# Patient Record
Sex: Female | Born: 1945 | Race: White | Hispanic: No | Marital: Married | Smoking: Never smoker
Health system: Southern US, Community
[De-identification: ages and names within clinical notes are randomized; demographics above are authoritative.]

## PROBLEM LIST (undated history)

## (undated) DIAGNOSIS — J45909 Unspecified asthma, uncomplicated: Secondary | ICD-10-CM

## (undated) DIAGNOSIS — H269 Unspecified cataract: Secondary | ICD-10-CM

## (undated) DIAGNOSIS — E119 Type 2 diabetes mellitus without complications: Secondary | ICD-10-CM

## (undated) DIAGNOSIS — M810 Age-related osteoporosis without current pathological fracture: Secondary | ICD-10-CM

## (undated) DIAGNOSIS — C801 Malignant (primary) neoplasm, unspecified: Secondary | ICD-10-CM

## (undated) DIAGNOSIS — R011 Cardiac murmur, unspecified: Secondary | ICD-10-CM

## (undated) DIAGNOSIS — M1990 Unspecified osteoarthritis, unspecified site: Secondary | ICD-10-CM

## (undated) DIAGNOSIS — D649 Anemia, unspecified: Secondary | ICD-10-CM

## (undated) DIAGNOSIS — K5790 Diverticulosis of intestine, part unspecified, without perforation or abscess without bleeding: Secondary | ICD-10-CM

## (undated) DIAGNOSIS — T7840XA Allergy, unspecified, initial encounter: Secondary | ICD-10-CM

## (undated) DIAGNOSIS — K219 Gastro-esophageal reflux disease without esophagitis: Secondary | ICD-10-CM

## (undated) DIAGNOSIS — I10 Essential (primary) hypertension: Secondary | ICD-10-CM

## (undated) DIAGNOSIS — E785 Hyperlipidemia, unspecified: Secondary | ICD-10-CM

## (undated) HISTORY — DX: Allergy, unspecified, initial encounter: T78.40XA

## (undated) HISTORY — DX: Type 2 diabetes mellitus without complications: E11.9

## (undated) HISTORY — DX: Anemia, unspecified: D64.9

## (undated) HISTORY — DX: Hyperlipidemia, unspecified: E78.5

## (undated) HISTORY — DX: Essential (primary) hypertension: I10

## (undated) HISTORY — PX: ABDOMINAL HYSTERECTOMY: SHX81

## (undated) HISTORY — DX: Age-related osteoporosis without current pathological fracture: M81.0

## (undated) HISTORY — DX: Diverticulosis of intestine, part unspecified, without perforation or abscess without bleeding: K57.90

## (undated) HISTORY — DX: Gastro-esophageal reflux disease without esophagitis: K21.9

## (undated) HISTORY — DX: Unspecified osteoarthritis, unspecified site: M19.90

## (undated) HISTORY — PX: COLONOSCOPY: SHX174

## (undated) HISTORY — DX: Unspecified cataract: H26.9

## (undated) HISTORY — DX: Unspecified asthma, uncomplicated: J45.909

## (undated) HISTORY — PX: APPENDECTOMY: SHX54

## (undated) HISTORY — DX: Malignant (primary) neoplasm, unspecified: C80.1

## (undated) HISTORY — DX: Cardiac murmur, unspecified: R01.1

---

## 1998-12-08 ENCOUNTER — Ambulatory Visit (HOSPITAL_COMMUNITY): Admission: RE | Admit: 1998-12-08 | Discharge: 1998-12-08 | Payer: Self-pay | Admitting: Family Medicine

## 1999-04-11 ENCOUNTER — Emergency Department (HOSPITAL_COMMUNITY): Admission: EM | Admit: 1999-04-11 | Discharge: 1999-04-11 | Payer: Self-pay | Admitting: Emergency Medicine

## 1999-04-11 ENCOUNTER — Encounter: Payer: Self-pay | Admitting: Emergency Medicine

## 2000-09-26 ENCOUNTER — Encounter: Admission: RE | Admit: 2000-09-26 | Discharge: 2000-09-26 | Payer: Self-pay | Admitting: Family Medicine

## 2000-09-26 ENCOUNTER — Encounter: Payer: Self-pay | Admitting: Family Medicine

## 2001-01-22 ENCOUNTER — Encounter: Payer: Self-pay | Admitting: Family Medicine

## 2001-01-22 ENCOUNTER — Encounter: Admission: RE | Admit: 2001-01-22 | Discharge: 2001-01-22 | Payer: Self-pay | Admitting: Family Medicine

## 2001-02-14 ENCOUNTER — Other Ambulatory Visit: Admission: RE | Admit: 2001-02-14 | Discharge: 2001-02-14 | Payer: Self-pay | Admitting: Obstetrics & Gynecology

## 2001-03-14 ENCOUNTER — Ambulatory Visit: Admission: RE | Admit: 2001-03-14 | Discharge: 2001-03-14 | Payer: Self-pay | Admitting: Gynecology

## 2001-03-16 ENCOUNTER — Encounter: Payer: Self-pay | Admitting: Obstetrics & Gynecology

## 2001-03-20 ENCOUNTER — Inpatient Hospital Stay (HOSPITAL_COMMUNITY): Admission: RE | Admit: 2001-03-20 | Discharge: 2001-03-22 | Payer: Self-pay | Admitting: Obstetrics & Gynecology

## 2001-03-20 ENCOUNTER — Encounter (INDEPENDENT_AMBULATORY_CARE_PROVIDER_SITE_OTHER): Payer: Self-pay

## 2001-10-08 ENCOUNTER — Encounter: Payer: Self-pay | Admitting: Family Medicine

## 2001-10-08 ENCOUNTER — Encounter: Admission: RE | Admit: 2001-10-08 | Discharge: 2001-10-08 | Payer: Self-pay | Admitting: Family Medicine

## 2003-12-13 HISTORY — PX: POLYPECTOMY: SHX149

## 2003-12-25 ENCOUNTER — Encounter: Admission: RE | Admit: 2003-12-25 | Discharge: 2003-12-25 | Payer: Self-pay | Admitting: Family Medicine

## 2005-05-11 ENCOUNTER — Encounter: Admission: RE | Admit: 2005-05-11 | Discharge: 2005-05-11 | Payer: Self-pay | Admitting: Internal Medicine

## 2006-05-29 ENCOUNTER — Encounter: Admission: RE | Admit: 2006-05-29 | Discharge: 2006-05-29 | Payer: Self-pay | Admitting: Internal Medicine

## 2009-04-24 ENCOUNTER — Encounter (INDEPENDENT_AMBULATORY_CARE_PROVIDER_SITE_OTHER): Payer: Self-pay | Admitting: *Deleted

## 2009-07-08 ENCOUNTER — Ambulatory Visit: Payer: Self-pay | Admitting: Gastroenterology

## 2009-07-21 ENCOUNTER — Ambulatory Visit: Payer: Self-pay | Admitting: Gastroenterology

## 2010-01-21 ENCOUNTER — Encounter: Admission: RE | Admit: 2010-01-21 | Discharge: 2010-01-21 | Payer: Self-pay | Admitting: Internal Medicine

## 2011-01-03 ENCOUNTER — Encounter: Payer: Self-pay | Admitting: Internal Medicine

## 2011-01-13 NOTE — Letter (Signed)
Summary: Recall Colonoscopy Letter  [facility]  [address] [place], [place] [address]   Phone: [phone]  Fax: [phone]      Apr 24, 2009 MRN: [id number]   [place] [address] [place], [place]  [address]   Dear Ms. Ford,   According to your medical record, it is time for you to schedule a Colonoscopy. The American Cancer Society recommends this procedure as a method to detect early colon cancer. Patients with a family history of colon cancer, or a personal history of colon polyps or inflammatory bowel disease are at increased risk.  This letter has beeen generated based on the recommendations made at the time of your procedure. If you feel that in your particular situation this may no longer apply, please contact our office.  Please call our office at [phone] to schedule this appointment or to update your records at your earliest convenience.  Thank you for cooperating with [place] to provide you with the very best care possible.   Sincerely,  Barbette Hair. Arlyce Dice, M.D.  [facility] Gastroenterology Division [phone]

## 2011-01-13 NOTE — Miscellaneous (Signed)
Summary: [facility]  Clinical Lists Changes  Medications: Added new medication of MIRALAX   POWD (POLYETHYLENE GLYCOL 3350) As per prep  instructions. - Signed Added new medication of DULCOLAX 5 MG  TBEC (BISACODYL) Day before procedure take 2 at 3pm and 2 at 8pm. - Signed Added new medication of REGLAN 10 MG  TABS (METOCLOPRAMIDE HCL) As per prep instructions. - Signed Rx of MIRALAX   POWD (POLYETHYLENE GLYCOL 3350) As per prep  instructions.;  #255gm x 0;  Signed;  Entered by: Ezra Sites RN;  Authorized by: Louis Meckel MD;  Method used: Electronically to [facility] [address] [place], [place], [place]  [address], Ph: [phone], Fax: [phone] Rx of DULCOLAX 5 MG  TBEC (BISACODYL) Day before procedure take 2 at 3pm and 2 at 8pm.;  #4 x 0;  Signed;  Entered by: Ezra Sites RN;  Authorized by: Louis Meckel MD;  Method used: Electronically to [facility] [address], [place], [place], [place]  [address], Ph: [phone], Fax: [phone] Rx of REGLAN 10 MG  TABS (METOCLOPRAMIDE HCL) As per prep instructions.;  #2 x 0;  Signed;  Entered by: Ezra Sites RN;  Authorized by: Louis Meckel MD;  Method used: Electronically to [facility] [address], [place], [place], [place]  [address], Ph: [phone], Fax: [phone] Allergies: Added new allergy or adverse reaction of PREDNISONE Observations: Added new observation of NKA: F (07/08/2009 8:47)    Prescriptions: REGLAN 10 MG  TABS (METOCLOPRAMIDE HCL) As per prep instructions.  #2 x 0   Entered by:   Ezra Sites RN   Authorized by:   Louis Meckel MD   Signed by:   Ezra Sites RN on 07/08/2009   Method used:   Electronically to        [facility] (retail)       [address]       [place], [place]  [address]       Ph: [phone]       Fax: [phone]   RxID:   [id number] DULCOLAX 5  MG  TBEC (BISACODYL) Day before procedure take 2 at 3pm and 2 at 8pm.  #4 x 0   Entered by:   Ezra Sites RN   Authorized by:   Louis Meckel MD   Signed by:   Ezra Sites RN on 07/08/2009   Method used:   Electronically to        [facility] (retail)       [address]       [place], [place]  [address]       Ph: [phone]       Fax: [phone]   RxID:   [id number] MIRALAX   POWD (POLYETHYLENE GLYCOL 3350) As per prep  instructions.  #255gm x 0   Entered by:   Ezra Sites RN   Authorized by:   Louis Meckel MD   Signed by:   Ezra Sites RN on 07/08/2009   Method used:   Electronically to        [facility] (retail)       [address]       [place], [place]  [address]       Ph: [phone]  Fax: [phone]   RxID:   [id number]

## 2011-01-13 NOTE — Procedures (Signed)
Summary: Colonoscopy   Colonoscopy  Procedure date:  07/21/2009  Findings:      Location:  [facility].    Procedures Next Due Date:    Colonoscopy: 07/2016 COLONOSCOPY PROCEDURE REPORT  PATIENT:  Debra Galvan, Debra Galvan  MR#:  [id number] BIRTHDATE:   [birth year], 63 yrs. old   GENDER:   female  ENDOSCOPIST:   Barbette Hair. Arlyce Dice, MD Referred by:   PROCEDURE DATE:  07/21/2009 PROCEDURE:  Colonoscopy, diagnostic ASA CLASS:   Class II INDICATIONS: history of pre-cancerous (adenomatous) colon polyps Last exam 2005  MEDICATIONS:    Fentanyl 100 mcg IV, Versed 10 mg IV, Benadryl 50 mg IV  DESCRIPTION OF PROCEDURE:   After the risks benefits and alternatives of the procedure were thoroughly explained, informed consent was obtained.  Digital rectal exam was performed and revealed no abnormalities.   The LB CF-[id number] endoscope was introduced through the anus and advanced to the cecum, which was identified by the ileocecal valve, without limitations.  The quality of the prep was adequate, using MiraLax.  The instrument was then slowly withdrawn as the colon was fully examined. <<PROCEDUREIMAGES>>                    <<OLD IMAGES>>  FINDINGS:  Mild diverticulosis was found in the sigmoid colon (see image1).  This was otherwise a normal examination of the colon (see image2, image3, image4, image5, image7, image8, image9, image11, image12, and image13).   Retroflexed views in the rectum revealed no abnormalities.    The scope was then withdrawn from the patient and the procedure completed.  COMPLICATIONS:   None  ENDOSCOPIC IMPRESSION:  1) Mild diverticulosis in the sigmoid colon  2) Otherwise normal examination RECOMMENDATIONS:  1) colonoscopy  7 years  REPEAT EXAM:   In 7 year(s) for Colonoscopy.   _______________________________ Barbette Hair. Arlyce Dice, MD  CC: Nila Nephew, MD

## 2011-04-29 NOTE — Consult Note (Signed)
[facility]  Patient:    Debra Galvan, Debra Galvan                       MRN: [id number] Proc. Date: 03/14/01 Attending:  Rande Brunt. Clarke-Pearson, M.D. CC:         Richard D. Arlyce Dice, M.D.  Telford Nab, R.N.   Consultation Report  HISTORY OF PRESENT ILLNESS:  [age]-year-old white married female referred by Dr. Caralyn Guile. Arlyce Dice for evaluation of a newly diagnosed pelvic mass.  The patient reports that she has had a "twinge" in her right lower quadrant over the past year or so.  She was recently evaluated and on routine pelvic exam, was found to have a mass.  The mass has been further typified with ultrasound showing that it measures 8.0 x 6.6 x 6.6 cm, with internal echoes and several thick septations.  The uterus is normal size and retroverted.  No free fluid is noted.  Patient has also had a CA125 value which measures only 11.5 u/ml.  PAST MEDICAL HISTORY:  Medical illnesses:  Hypertension, elevated cholesterol, asthma (mild), gastroesophageal reflux disease.  PAST SURGERY:  Oral surgery only.  DRUG ALLERGIES:  PREDNISONE causes increased anxiety.  PENICILLIN causes a rash.  CURRENT MEDICATIONS:  Lipitor, hydrochlorothiazide, Toprol, Ventolin inhaler p.r.n.  FAMILY HISTORY:  Family history reveals a maternal great-aunt with breast cancer and maternal aunts with pancreatic cancer and a grandfather with colon cancer.  REVIEW OF SYSTEMS:  Review of systems is otherwise negative.  PHYSICAL EXAMINATION:  GENERAL:  The patient is a healthy white female in no acute distress.  HEENT:  Negative.  NECK:  Neck is supple without thyromegaly.  NODES:  There is supraclavicular or inguinal adenopathy.  ABDOMEN:  The abdomen is slightly obese, soft and nontender.  No mass, organomegaly, ascites or herniae are noted.  PELVIC:  EG/BUS normal.  Vagina is clean, well-supported.  The cervix is normal.  Uterus is normal shape, size and consistency.   There is an 8-cm smooth cystic mass in the posterior pelvis.  Rectovaginal exam confirms.  IMPRESSION:  Complex pelvic mass with a normal CA125, rule out ovarian neoplasm.  I had a lengthy discussion with the patient and her husband regarding the management of benign and malignant ovarian tumors.  They are aware of the risks of surgery including hemorrhage or infection, injury to adjacent viscera and thromboembolic complications.  They are further aware that if this is found to be a cancer, extended surgical staging including omentectomy, pelvic and para-aortic lymphadenectomy may be performed.  Further, they are aware that she may require postoperative chemotherapy.  All of their questions are answered and they wish to proceed with surgery next week. DD:  03/14/01 TD:  03/15/01 Job: [id number]

## 2011-04-29 NOTE — Op Note (Signed)
[facility]  Patient:    Debra Galvan, Debra Galvan                  MRN: [id number] Proc. Date: 03/20/01 Adm. Date:  [id number] Attending:  Lars Pinks CC:         Rande Brunt. Clarke-Pearson, M.D.                           Operative Report  PREOPERATIVE DIAGNOSIS:  Right adnexal mass.  POSTOPERATIVE DIAGNOSIS:  Right mucinous cystadenoma.  PROCEDURE:  Total abdominal hysterectomy, bilateral salpingo-oophorectomy, appendectomy.  SURGEON:  Richard D. Arlyce Dice, M.D.  ASSISTANT:  Rande Brunt. Clarke-Pearson, M.D.  ANESTHESIA:  General endotracheal.  ESTIMATED BLOOD LOSS:  200 cc.  FINDINGS:  Six-centimeter mucinous cystadenoma.  The pelvis appeared normal, the appendix was normal, the left ovary was atrophic and the uterus was normal.  There was no evidence of endometriosis or adhesions in the pelvis and the upper abdomen appeared normal.  INDICATIONS:  This is a 65 year old gravida 3, para 3 that was found to have a right adnexal mass on pelvic exam and confirmed by ultrasound.  Preoperative evaluation showed a CA125 of 11.5.  The surgical options were discussed with the patient and the decision was made to proceed with total abdominal hysterectomy and bilateral salpingo-oophorectomy at minimum and to do further cancer staging if on frozen section ovarian cancer were found to be present.  DESCRIPTION OF PROCEDURE:  The patient was taken to the operating room, placed in supine position and general endotracheal anesthesia was induced.  She was then placed in the modified dorsal lithotomy position and the abdomen and perineum and vagina were prepped and draped in a sterile fashion.  The bladder was catheterized.  A low vertical incision was made and carried down to the fascia, which extended vertically.  The rectus muscle was divided in the midline and the peritoneum was entered by sharp and blunt dissection.  This incision was then extended vertically  as well.  The abdominal retractor was then placed, the bowel was packed, the pelvis was inspected.  The round ligament on the right side was divided and the retroperitoneal space was entered.  The ureter was identified.  The infundibulopelvic ligament was isolated and doubly ligated.  The dissection was then carried around anteriorly to create the bladder flap.  The right adnexal mass was then mobilized and removed and sent for frozen section.  The left round ligament was then divided and the infundibulopelvic ligament on the left side was isolated and divided and doubly ligated.  As the bladder was displaced inferiorly, the uterine arteries were then clamped, cut and ligated.  The parametrial tissues were then divided and the vagina was entered and the uterus, cervix and left adnexa were removed.  The vaginal cuff was closed with figure-of-eight Vicryl 1 sutures.  There was some bleeding at the bladder flap and this was controlled with Hemoclips and cautery.  At this point, attention was turned to the appendix, which appeared normal.  The appendiceal artery was isolated and ligated.  The appendix was then removed and the stump was ligated and then oversewn into the cecum with a pursestring silk suture.  The peritoneum was irrigated.  The abdomen was closed with a running Smead-Jones closure using 0 PDS suture.  The skin was then closed with staples.  The patient tolerated the procedure well and left the operating room in good condition. DD:  03/20/01  TD:  03/20/01 Job: [id number]

## 2011-04-29 NOTE — Discharge Summary (Signed)
[facility]  Patient:    Debra Galvan, Debra Galvan                  MRN: [id number] Adm. Date:  [id number] Disc. Date: 03/22/01 Attending:  Lars Pinks                           Discharge Summary  DISCHARGE DIAGNOSIS:  Mucinous cyst adenoma.  PROCEDURE:  Total abdominal hysterectomy with bilateral salpingo-oophorectomy and appendectomy.  No complications.  CONDITION ON DISCHARGE:  Improved.  HISTORY OF PRESENT ILLNESS:  This is a 65 year old, G3, P3 who was admitted for evaluation of a right adnexal mass.  The patient was noted to have a 6 cm complex, right ovarian cyst on office ultrasound approximately two weeks prior to admission.  CA 125 was drawn at that time which was 11.  She had consultation with Dr. De Blanch who agreed that the mass had to be removed for evaluation to rule out ovarian cancer.  HOSPITAL COURSE:  The patient was brought to the operating room on the day of admission where a right salpingo-oophorectomy was performed and frozen section revealed benign mucinous cyst adenoma.  Based upon the patients request after consultation preoperatively, the decision was made to proceed with total abdominal hysterectomy and removal of the left tube and ovary.  This was performed during the same operation without complication.  The patients postoperative course is benign without significant fever or anemia.  On postop day #2, the patient was ambulating well, urinating and passing flatus.  Her pain was controlled with oral pain medications and she was felt to be ready for discharge.  DIET:  Regular.  ACTIVITY:  Limit activity.  DISCHARGE MEDICATIONS: 1. Tylox 30 tablets one to two every four hours for pain. 2. She was told to    try ibuprofen or Naproxen first and then to use the narcotic if her pain    persisted. 3. Iron supplementation once her stools were normal. 4. She was to return to the office in five days to have  her staples removed    and in three weeks for routine evaluation.  LABORATORY DATA AND X-RAY FINDINGS:  Her hemoglobin preoperatively was 12.4 with a white count of 8700.  Liver function tests and routine chemistries were all normal.  Blood type was O positive.  Postoperatively, her hemoglobin was 9.9, white count 13.3.  Her pathology report revealed right ovary and fallopian tube benign mucinous cyst adenoma.  Her uterus and left ovary were benign. DD:  03/22/01 TD:  03/22/01 Job: [id number]

## 2011-04-29 NOTE — H&P (Signed)
[facility]  Patient:    Debra Galvan, Debra Galvan                       MRN: [id number] Adm. Date:  03/20/01 Attending:  Caralyn Guile. Arlyce Dice, M.D.                         History and Physical  HISTORY OF PRESENT ILLNESS:  This is a 65 year old gravida 3, para 3, who is admitted for evaluation of a right adnexal mass.  The patient came to the office for her regular physical exam and complaining of occasional twinging in the right lower quadrant.  Otherwise the patient had no complaints.  On physical exam, an adnexal mass was felt in the right side and on ultrasound, a 6 x 8 cm complex mass was confirmed.  Based upon this presentation, a decision was made to proceed with operative evaluation to rule out ovarian cancer.  PAST MEDICAL HISTORY:  Positive for hypertension, for which he is being treated with Toprol and hydrochlorothiazide.  Patient also has asthma, for which he takes an albuterol inhaler, and hypercholesterolemia for which she is on Lipitor.  The patient has had no previous abdominal surgery.  MEDICATIONS:  Toprol, Lipitor, hydrochlorothiazide, and albuterol inhaler.  ALLERGIES:  SULFA and PREDNISONE.  She has never had a blood transfusion.  FAMILY HISTORY:  Negative for breast, ovarian, or colon cancer.  SOCIAL HISTORY:  She does not smoke and she does not drink alcohol to excess. She does not use recreational drugs.  REVIEW OF SYSTEMS:  Negative in detail.  PHYSICAL EXAMINATION:  VITAL SIGNS:  Blood pressure 140/90, height 5 feet 6 inches, weight 188 pounds.  She is afebrile.  Her pulse is 80 and regular.  HEENT:  Ears, nose, and throat normal.  NECK:  Supple without thyromegaly.  BREASTS:  Without masses.  HEART:  Shows a 2/6 systolic ejection murmur at the left base.  LUNGS:  Clear to auscultation and percussion.  ABDOMEN:  Soft without hepatosplenomegaly.  PELVIC:  External genitalia, vagina, and cervix are all normal.  Uterus  is retroverted, normal size, shape, and contour.  The right adnexa reveals a mass approximately 6 cm.  RECTAL:  Normal.  Hemoccult was negative.  LABORATORY:  Blood test was done preoperatively, measuring her CA-125 came back normal at 11.5.  A preoperative chest x-ray showed no evidence of acute cardiac or pulmonary problems.  ADMITTING DIAGNOSIS:  A complex right adnexal mass, rule out ovarian cancer. Hopefully this is a benign cystadenoma or even an old endometrioma.  DISCUSSION OF HER HOSPITAL PLAN:  Per discussion with the patient, a total abdominal hysterectomy and bilateral salpingo-oophorectomy will be performed and if a frozen section of her adnexal mass reveals cancer, then additional surgery will be undertaken as appropriate for staging and debulking. DD:  03/19/01 TD:  03/19/01 Job: [id number]

## 2011-04-29 NOTE — Consult Note (Signed)
[facility]  Patient:    Debra Galvan, Debra Galvan                       MRN: [id number] Proc. Date: 03/14/01 Attending:  Rande Brunt. Clarke-Pearson, M.D. CC:         Richard D. Arlyce Dice, M.D.  Telford Nab, R.N.   Consultation Report  HISTORY OF PRESENT ILLNESS:  [age]-year-old white married female referred by Dr. Caralyn Guile. Arlyce Dice for evaluation of a newly diagnosed pelvic mass.  The patient reports that she has had a "twinge" in her right lower quadrant for the past several years.  She has not been having annual examinations.  She did present to Dr. Arlyce Dice recently, who discovered a pelvic mass.  The ultrasound has been performed showing a 6.6 x 6.6 x 8.0-cm mass in the right adnexa with internal echoes and several fixed septations.  No free fluid was seen.  The uterus is relatively normal but retroverted.  CA125 value is 11.5 u/ml. Patient has had a recent Pap smear.  She denies any GI or GU symptoms, has no other pelvic pain or abdominal pressure.  PAST MEDICAL HISTORY:  Medical illnesses:  Hypertension, hypercholesterolemia, mild asthma, gastroesophageal reflux disease.  PAST SURGERY:  Oral surgery only.  CURRENT MEDICATIONS:  Lipitor, hydrochlorothiazide, Toprol and bronchodilator inhaler p.r.n.  ALLERGIES:  PENICILLIN and PREDNISONE.  OBSTETRICAL HISTORY:  Gravida 3.  GYNECOLOGICAL HISTORY:  Gynecologic history is negative except for that noted in history of present illness.  REVIEW OF SYSTEMS:  Review of systems is negative except for that noted in present illness.  FAMILY HISTORY:  Family history reveals a maternal great-aunt with breast cancer, a maternal aunt with pancreatic cancer and a grandfather with colon cancer.  PHYSICAL EXAMINATION:  GENERAL:  Physical exam reveals a well-developed white female in no acute distress.  HEENT:  Negative.  NECK:  Neck is supple without thyromegaly.  NODES:  There is no supraclavicular or  inguinal adenopathy.  ABDOMEN:  The abdomen is moderately obese, soft, nontender.  No mass, organomegaly, ascites or herniae are noted.  PELVIC:  EG/BUS normal.  Vagina is clean and cervix is normal.  Uterus is deviated slightly to the left.  Behind and to the right of the uterus is approximately an 8-cm smooth cystic mass.  Rectovaginal exam confirms.  IMPRESSION:  Complex pelvic mass with a normal CA125 value.  We would recommend the patient undergo exploratory laparotomy, total abdominal hysterectomy and bilateral salpingo-oophorectomy so that we can determine the exact histology of this mass.  The risks of surgery including hemorrhage or infection, injury to adjacent viscera and thromboembolic complications were outlined to the patient and her husband.  All of their questions are answered and we will proceed with surgery in coordination with Dr. Arlyce Dice next week. DD: 03/14/01 TD:  03/14/01 Job: [id number]

## 2011-08-24 ENCOUNTER — Other Ambulatory Visit: Payer: Self-pay | Admitting: Internal Medicine

## 2011-08-24 DIAGNOSIS — Z1231 Encounter for screening mammogram for malignant neoplasm of breast: Secondary | ICD-10-CM

## 2011-09-02 ENCOUNTER — Ambulatory Visit
Admission: RE | Admit: 2011-09-02 | Discharge: 2011-09-02 | Disposition: A | Discharge status: Home / Self Care | Payer: Medicare Other | Source: Ambulatory Visit | Attending: Internal Medicine | Admitting: Internal Medicine

## 2011-09-02 DIAGNOSIS — Z1231 Encounter for screening mammogram for malignant neoplasm of breast: Secondary | ICD-10-CM

## 2012-09-10 ENCOUNTER — Other Ambulatory Visit: Payer: Self-pay | Admitting: Internal Medicine

## 2012-09-10 DIAGNOSIS — Z1231 Encounter for screening mammogram for malignant neoplasm of breast: Secondary | ICD-10-CM

## 2012-10-03 ENCOUNTER — Ambulatory Visit
Admission: RE | Admit: 2012-10-03 | Discharge: 2012-10-03 | Disposition: A | Discharge status: Home / Self Care | Payer: Medicare Other | Source: Ambulatory Visit | Attending: Internal Medicine | Admitting: Internal Medicine

## 2012-10-03 DIAGNOSIS — Z1231 Encounter for screening mammogram for malignant neoplasm of breast: Secondary | ICD-10-CM

## 2013-10-02 ENCOUNTER — Other Ambulatory Visit: Payer: Self-pay

## 2013-10-02 DIAGNOSIS — Z1231 Encounter for screening mammogram for malignant neoplasm of breast: Secondary | ICD-10-CM

## 2013-10-18 ENCOUNTER — Ambulatory Visit
Admission: RE | Admit: 2013-10-18 | Discharge: 2013-10-18 | Disposition: A | Discharge status: Home / Self Care | Payer: Medicare Other | Source: Ambulatory Visit

## 2013-10-18 DIAGNOSIS — Z1231 Encounter for screening mammogram for malignant neoplasm of breast: Secondary | ICD-10-CM

## 2013-11-18 ENCOUNTER — Encounter: Payer: Self-pay | Admitting: Cardiology

## 2014-07-24 ENCOUNTER — Encounter: Payer: Self-pay | Admitting: Gastroenterology

## 2014-10-13 ENCOUNTER — Ambulatory Visit (INDEPENDENT_AMBULATORY_CARE_PROVIDER_SITE_OTHER): Payer: Medicare Other | Admitting: Internal Medicine

## 2014-10-13 VITALS — BP 142/72 | HR 72 | Temp 98.6°F | Resp 16 | Ht 65.0 in | Wt 190.0 lb

## 2014-10-13 DIAGNOSIS — J0100 Acute maxillary sinusitis, unspecified: Secondary | ICD-10-CM

## 2014-10-13 MED ORDER — AMOXICILLIN 875 MG PO TABS: 875.0000 mg | ORAL_TABLET | Freq: Two times a day (BID) | ORAL | Status: DC

## 2014-10-13 NOTE — Progress Notes (Signed)
   Subjective:  This chart was scribed for Debra Koyanagi, MD by Debra Galvan, ED Scribe. The patient was seen in room 2. Patient's care was started at 4:58 PM.   Patient ID: Debra Galvan, female    DOB: 1946-06-09, 68 y.o.   MRN: [id number]  Chief Complaint  Patient presents with  . Cough    x 4 days  . Ear Pain    right, pressure   HPI HPI Comments: Debra Galvan is a 68 y.o. female who presents to the Emergency Department complaining of persistent cough onset 4 days ago. Pt reports associated fever 4 days ago that has resolved, sore throat 2 weeks ago, postnasal drip, nodule at R tear duck onset this morning, and R ear sensitivity. She denies rhinorrhea. She reports sick contacts with URIs and ear infections. She has tried Proair inhaler once and cough syrup with mild relief.    Past Medical History  Diagnosis Date  . Allergy   . Anemia   . Asthma   . Cancer   . GERD (gastroesophageal reflux disease)   . Heart murmur   . Hypertension   . Osteoporosis    No current outpatient prescriptions on file prior to visit.   No current facility-administered medications on file prior to visit.   Allergies  Allergen Reactions  . Prednisone     REACTION: Hyperactivity    Review of Systems  Constitutional: Positive for fever (resolved).  HENT: Positive for ear pain, postnasal drip and sore throat (resolved). Negative for rhinorrhea.   Respiratory: Positive for cough.   Allergic/Immunologic: Positive for environmental allergies.      Objective:   Physical Exam  Constitutional: She is oriented to person, place, and time. She appears well-developed and well-nourished. No distress.  HENT:  Head: Normocephalic and atraumatic.  Right Ear: Tympanic membrane normal.  Nares with purulent mucous   Eyes: Conjunctivae and EOM are normal.  Neck: Neck supple.  Cardiovascular: Normal rate and regular rhythm.   Pulmonary/Chest: Effort normal. No respiratory distress. She has  wheezes.  Lungs clear with the exception of wheezing at the end of forced expirations   Musculoskeletal: Normal range of motion.  Lymphadenopathy:    She has no cervical adenopathy.  Neurological: She is alert and oriented to person, place, and time.  Skin: Skin is warm and dry.  Psychiatric: She has a normal mood and affect. Her behavior is normal.  Nursing note and vitals reviewed.  Triage Vitals: BP 142/72 mmHg  Pulse 72  Temp(Src) 98.6 F (37 C)  Resp 16  Ht 5\' 5"  (1.651 m)  Wt 190 lb (86.183 kg)  BMI 31.62 kg/m2  SpO2 94%     Assessment & Plan:   I personally performed the services described in this documentation, which was scribed in my presence. The recorded information has been reviewed and is accurate.  Acute maxillary sinusitis, recurrence not specified  Amoxicillin

## 2015-04-10 ENCOUNTER — Other Ambulatory Visit: Payer: Self-pay

## 2015-04-10 DIAGNOSIS — Z1231 Encounter for screening mammogram for malignant neoplasm of breast: Secondary | ICD-10-CM

## 2015-04-14 ENCOUNTER — Ambulatory Visit
Admission: RE | Admit: 2015-04-14 | Discharge: 2015-04-14 | Disposition: A | Discharge status: Home / Self Care | Payer: Medicare Other | Source: Ambulatory Visit

## 2015-04-14 DIAGNOSIS — Z1231 Encounter for screening mammogram for malignant neoplasm of breast: Secondary | ICD-10-CM

## 2016-05-04 ENCOUNTER — Other Ambulatory Visit: Payer: Self-pay

## 2016-05-04 DIAGNOSIS — Z1231 Encounter for screening mammogram for malignant neoplasm of breast: Secondary | ICD-10-CM

## 2016-05-25 ENCOUNTER — Ambulatory Visit: Payer: Medicare Other

## 2016-06-01 ENCOUNTER — Ambulatory Visit
Admission: RE | Admit: 2016-06-01 | Discharge: 2016-06-01 | Disposition: A | Discharge status: Home / Self Care | Payer: Medicare Other | Source: Ambulatory Visit

## 2016-06-01 DIAGNOSIS — Z1231 Encounter for screening mammogram for malignant neoplasm of breast: Secondary | ICD-10-CM

## 2016-07-11 ENCOUNTER — Encounter: Payer: Self-pay | Admitting: Gastroenterology

## 2017-01-10 ENCOUNTER — Other Ambulatory Visit: Payer: Self-pay | Admitting: Internal Medicine

## 2017-01-10 DIAGNOSIS — M8580 Other specified disorders of bone density and structure, unspecified site: Secondary | ICD-10-CM

## 2017-01-18 ENCOUNTER — Ambulatory Visit
Admission: RE | Admit: 2017-01-18 | Discharge: 2017-01-18 | Disposition: A | Discharge status: Home / Self Care | Payer: Medicare Other | Source: Ambulatory Visit | Attending: Internal Medicine | Admitting: Internal Medicine

## 2017-01-18 DIAGNOSIS — M8580 Other specified disorders of bone density and structure, unspecified site: Secondary | ICD-10-CM

## 2017-05-02 ENCOUNTER — Ambulatory Visit
Admission: RE | Admit: 2017-05-02 | Discharge: 2017-05-02 | Disposition: A | Discharge status: Home / Self Care | Payer: Medicare Other | Source: Ambulatory Visit | Attending: Internal Medicine | Admitting: Internal Medicine

## 2017-05-02 ENCOUNTER — Other Ambulatory Visit: Payer: Self-pay | Admitting: Internal Medicine

## 2017-05-02 DIAGNOSIS — M79641 Pain in right hand: Secondary | ICD-10-CM

## 2017-05-02 DIAGNOSIS — M25531 Pain in right wrist: Secondary | ICD-10-CM

## 2017-08-18 ENCOUNTER — Other Ambulatory Visit: Payer: Self-pay | Admitting: Internal Medicine

## 2017-08-18 DIAGNOSIS — Z1231 Encounter for screening mammogram for malignant neoplasm of breast: Secondary | ICD-10-CM

## 2017-08-25 ENCOUNTER — Ambulatory Visit: Payer: Medicare Other

## 2017-09-25 ENCOUNTER — Ambulatory Visit
Admission: RE | Admit: 2017-09-25 | Discharge: 2017-09-25 | Disposition: A | Discharge status: Home / Self Care | Payer: Medicare Other | Source: Ambulatory Visit | Attending: Internal Medicine | Admitting: Internal Medicine

## 2017-09-25 DIAGNOSIS — Z1231 Encounter for screening mammogram for malignant neoplasm of breast: Secondary | ICD-10-CM

## 2018-10-03 ENCOUNTER — Other Ambulatory Visit: Payer: Self-pay | Admitting: Internal Medicine

## 2018-10-03 DIAGNOSIS — Z1231 Encounter for screening mammogram for malignant neoplasm of breast: Secondary | ICD-10-CM

## 2018-11-15 ENCOUNTER — Ambulatory Visit
Admission: RE | Admit: 2018-11-15 | Discharge: 2018-11-15 | Disposition: A | Discharge status: Home / Self Care | Payer: Medicare Other | Source: Ambulatory Visit | Attending: Internal Medicine | Admitting: Internal Medicine

## 2018-11-15 DIAGNOSIS — Z1231 Encounter for screening mammogram for malignant neoplasm of breast: Secondary | ICD-10-CM

## 2019-01-03 ENCOUNTER — Other Ambulatory Visit (HOSPITAL_COMMUNITY): Payer: Self-pay | Admitting: Internal Medicine

## 2019-01-03 ENCOUNTER — Ambulatory Visit (HOSPITAL_COMMUNITY)
Admission: RE | Admit: 2019-01-03 | Discharge: 2019-01-03 | Disposition: A | Discharge status: Home / Self Care | Payer: Medicare Other | Source: Ambulatory Visit | Attending: Internal Medicine | Admitting: Internal Medicine

## 2019-01-03 DIAGNOSIS — M7989 Other specified soft tissue disorders: Secondary | ICD-10-CM | POA: Insufficient documentation

## 2019-01-03 DIAGNOSIS — M79604 Pain in right leg: Secondary | ICD-10-CM | POA: Diagnosis present

## 2019-01-03 NOTE — Progress Notes (Signed)
RLE venous duplex       has been completed. Preliminary results can be found under CV proc through chart review. Gerardo Territo, BS, RDMS, RVT   

## 2019-07-08 ENCOUNTER — Encounter: Payer: Self-pay | Admitting: Gastroenterology

## 2019-07-17 ENCOUNTER — Other Ambulatory Visit (HOSPITAL_COMMUNITY): Payer: Self-pay | Admitting: Internal Medicine

## 2019-07-17 DIAGNOSIS — R011 Cardiac murmur, unspecified: Secondary | ICD-10-CM

## 2019-07-18 ENCOUNTER — Ambulatory Visit (HOSPITAL_COMMUNITY)
Admission: RE | Admit: 2019-07-18 | Discharge: 2019-07-18 | Disposition: A | Discharge status: Home / Self Care | Payer: Medicare Other | Source: Ambulatory Visit | Attending: Internal Medicine | Admitting: Internal Medicine

## 2019-07-18 ENCOUNTER — Other Ambulatory Visit: Payer: Self-pay

## 2019-07-18 DIAGNOSIS — R011 Cardiac murmur, unspecified: Secondary | ICD-10-CM | POA: Insufficient documentation

## 2019-07-18 NOTE — Progress Notes (Signed)
  Echocardiogram 2D Echocardiogram has been performed.  Debra Galvan 07/18/2019, 1:25 PM

## 2019-07-25 ENCOUNTER — Encounter: Payer: Self-pay | Admitting: Gastroenterology

## 2019-08-16 ENCOUNTER — Other Ambulatory Visit: Payer: Self-pay

## 2019-08-16 ENCOUNTER — Ambulatory Visit (AMBULATORY_SURGERY_CENTER): Payer: Self-pay | Admitting: *Deleted

## 2019-08-16 VITALS — Temp 97.1°F | Ht 65.5 in | Wt 176.0 lb

## 2019-08-16 DIAGNOSIS — Z86010 Personal history of colonic polyps: Secondary | ICD-10-CM

## 2019-08-16 MED ORDER — PEG 3350-KCL-NA BICARB-NACL 420 G PO SOLR: 4000.0000 mL | Freq: Once | ORAL | 0 refills | Status: AC

## 2019-08-16 NOTE — Progress Notes (Signed)
No egg or soy allergy known to patient  No issues with past sedation with any surgeries  or procedures, no intubation problems  No diet pills per patient No home 02 use per patient  No blood thinners per patient  Pt denies issues with constipation  No A fib or A flutter  EMMI video sent to pt's e mail  

## 2019-08-30 ENCOUNTER — Encounter: Payer: Self-pay | Admitting: Gastroenterology

## 2019-08-30 ENCOUNTER — Ambulatory Visit (AMBULATORY_SURGERY_CENTER): Payer: Medicare Other | Admitting: Gastroenterology

## 2019-08-30 ENCOUNTER — Other Ambulatory Visit: Payer: Self-pay | Admitting: Gastroenterology

## 2019-08-30 ENCOUNTER — Other Ambulatory Visit: Payer: Self-pay

## 2019-08-30 VITALS — BP 104/49 | HR 52 | Temp 98.4°F | Resp 25 | Ht 65.0 in | Wt 176.0 lb

## 2019-08-30 DIAGNOSIS — Z1211 Encounter for screening for malignant neoplasm of colon: Secondary | ICD-10-CM

## 2019-08-30 DIAGNOSIS — D120 Benign neoplasm of cecum: Secondary | ICD-10-CM | POA: Diagnosis not present

## 2019-08-30 DIAGNOSIS — D127 Benign neoplasm of rectosigmoid junction: Secondary | ICD-10-CM

## 2019-08-30 DIAGNOSIS — D123 Benign neoplasm of transverse colon: Secondary | ICD-10-CM

## 2019-08-30 DIAGNOSIS — D128 Benign neoplasm of rectum: Secondary | ICD-10-CM

## 2019-08-30 DIAGNOSIS — Z86010 Personal history of colonic polyps: Secondary | ICD-10-CM | POA: Diagnosis not present

## 2019-08-30 MED ORDER — SODIUM CHLORIDE 0.9 % IV SOLN: 500.0000 mL | Freq: Once | INTRAVENOUS | Status: DC

## 2019-08-30 NOTE — Patient Instructions (Signed)
YOU HAD AN ENDOSCOPIC PROCEDURE TODAY AT THE [facility]:   Refer to the procedure report that was given to you for any specific questions about what was found during the examination.  If the procedure report does not answer your questions, please call your gastroenterologist to clarify.  If you requested that your care partner not be given the details of your procedure findings, then the procedure report has been included in a sealed envelope for you to review at your convenience later.  YOU SHOULD EXPECT: Some feelings of bloating in the abdomen. Passage of more gas than usual.  Walking can help get rid of the air that was put into your GI tract during the procedure and reduce the bloating. If you had a lower endoscopy (such as a colonoscopy or flexible sigmoidoscopy) you may notice spotting of blood in your stool or on the toilet paper. If you underwent a bowel prep for your procedure, you may not have a normal bowel movement for a few days.  Please Note:  You might notice some irritation and congestion in your nose or some drainage.  This is from the oxygen used during your procedure.  There is no need for concern and it should clear up in a day or so.  SYMPTOMS TO REPORT IMMEDIATELY:   Following lower endoscopy (colonoscopy or flexible sigmoidoscopy):  Excessive amounts of blood in the stool  Significant tenderness or worsening of abdominal pains  Swelling of the abdomen that is new, acute  Fever of 100F or higher   For urgent or emergent issues, a gastroenterologist can be reached at any hour by calling [phone].   DIET:  We do recommend a small meal at first, but then you may proceed to your regular diet.  Drink plenty of fluids but you should avoid alcoholic beverages for 24 hours.  MEDICATIONS: Continue present medications.  Please see handouts given to you by your recovery nurse.  ACTIVITY:  You should plan to take it easy for the rest of today and you should  NOT DRIVE or use heavy machinery until tomorrow (because of the sedation medicines used during the test).    FOLLOW UP: Our staff will call the number listed on your records 48-72 hours following your procedure to check on you and address any questions or concerns that you may have regarding the information given to you following your procedure. If we do not reach you, we will leave a message.  We will attempt to reach you two times.  During this call, we will ask if you have developed any symptoms of COVID 19. If you develop any symptoms (ie: fever, flu-like symptoms, shortness of breath, cough etc.) before then, please call [phone].  If you test positive for Covid 19 in the 2 weeks post procedure, please call and report this information to us.    If any biopsies were taken you will be contacted by phone or by letter within the next 1-3 weeks.  Please call us at [phone] if you have not heard about the biopsies in 3 weeks.   Thank you for allowing us to provide for your healthcare needs today.   SIGNATURES/CONFIDENTIALITY: You and/or your care partner have signed paperwork which will be entered into your electronic medical record.  These signatures attest to the fact that that the information above on your After Visit Summary has been reviewed and is understood.  Full responsibility of the confidentiality of this discharge information lies with you and/or   your care-partner. 

## 2019-08-30 NOTE — Progress Notes (Signed)
Temp check by KA/vital check by CW.  No changes in medical or surgical history from pre-visit screening on 9/4.

## 2019-08-30 NOTE — Progress Notes (Signed)
A and O x3. Report to RN. Tolerated MAC anesthesia well.

## 2019-08-30 NOTE — Progress Notes (Signed)
Called to room to assist during endoscopic procedure.  Patient ID and intended procedure confirmed with present staff. Received instructions for my participation in the procedure from the performing physician.  

## 2019-08-30 NOTE — Op Note (Signed)
[facility] Patient Name: [name] Procedure Date: 08/30/2019 11:19 AM MRN: [id number] Endoscopist: Remo Lipps P. Havery Moros , MD Age: 73 Referring MD:  Date of Birth: 06-Jul-1946 Gender: Female Account #: [id number] Procedure:                Colonoscopy Indications:              Screening for colorectal malignant neoplasm Medicines:                Monitored Anesthesia Care Procedure:                Pre-Anesthesia Assessment:                           - Prior to the procedure, a History and Physical                            was performed, and patient medications and                            allergies were reviewed. The patient's tolerance of                            previous anesthesia was also reviewed. The risks                            and benefits of the procedure and the sedation                            options and risks were discussed with the patient.                            All questions were answered, and informed consent                            was obtained. Prior Anticoagulants: The patient has                            taken no previous anticoagulant or antiplatelet                            agents. ASA Grade Assessment: II - A patient with                            mild systemic disease. After reviewing the risks                            and benefits, the patient was deemed in                            satisfactory condition to undergo the procedure.                           After obtaining informed consent, the colonoscope  was passed under direct vision. Throughout the                            procedure, the patient's blood pressure, pulse, and                            oxygen saturations were monitored continuously. The                            Colonoscope was introduced through the anus and                            advanced to the the cecum, identified by                            appendiceal orifice  and ileocecal valve. The                            colonoscopy was technically difficult and complex                            due to restricted mobility of the colon. The                            patient tolerated the procedure well. The quality                            of the bowel preparation was adequate. The                            ileocecal valve, appendiceal orifice, and rectum                            were photographed. Scope In: 11:23:51 AM Scope Out: 11:53:40 AM Scope Withdrawal Time: 0 hours 18 minutes 21 seconds  Total Procedure Duration: 0 hours 29 minutes 49 seconds  Findings:                 The perianal and digital rectal examinations were                            normal.                           Two sessile polyps were found in the cecum. The                            polyps were 3 to 4 mm in size. These polyps were                            removed with a cold snare. Resection and retrieval                            were complete.  A 5 mm polyp was found in the hepatic flexure. The                            polyp was sessile. The polyp was removed with a                            cold snare. Resection and retrieval were complete.                           A diminutive polyp was found in the recto-sigmoid                            colon. The polyp was sessile. The polyp was removed                            with a cold snare. Resection and retrieval were                            complete.                           Multiple small-mouthed diverticula were found in                            the sigmoid colon.                           The colon was tortuous with a severely restricted                            rectosigmoid / distal sigmoid colon. Pediatric                            colonoscope used to traverse the colon.                           The exam was otherwise without abnormality. Complications:            No  immediate complications. Estimated blood loss:                            Minimal. Estimated Blood Loss:     Estimated blood loss was minimal. Impression:               - Two 3 to 4 mm polyps in the cecum, removed with a                            cold snare. Resected and retrieved.                           - One 5 mm polyp at the hepatic flexure, removed                            with a cold snare. Resected and retrieved.                           -  One diminutive polyp at the recto-sigmoid colon,                            removed with a cold snare. Resected and retrieved.                           - Diverticulosis in the sigmoid colon.                           - Tortuous colon with restricted left colon as                            above.                           - The examination was otherwise normal. Recommendation:           - Patient has a contact number available for                            emergencies. The signs and symptoms of potential                            delayed complications were discussed with the                            patient. Return to normal activities tomorrow.                            Written discharge instructions were provided to the                            patient.                           - Resume previous diet.                           - Continue present medications.                           - Await pathology results. Remo Lipps P. Armbruster, MD 08/30/2019 11:58:46 AM This report has been signed electronically.

## 2019-09-03 ENCOUNTER — Telehealth: Payer: Self-pay | Admitting: *Deleted

## 2019-09-03 NOTE — Telephone Encounter (Signed)
  Follow up Call-  Call back number 08/30/2019  Post procedure Call Back phone  # [phone]  Permission to leave phone message Yes  Some recent data might be hidden     Patient questions:  Do you have a fever, pain , or abdominal swelling? No. Pain Score  0 *  Have you tolerated food without any problems? Yes.    Have you been able to return to your normal activities? Yes.    Do you have any questions about your discharge instructions: Diet   No. Medications  No. Follow up visit  No.  Do you have questions or concerns about your Care? No.  Actions: * If pain score is 4 or above: No action needed, pain <4.  1. Have you developed a fever since your procedure? no  2.   Have you had an respiratory symptoms (SOB or cough) since your procedure? no  3.   Have you tested positive for COVID 19 since your procedure no  4.   Have you had any family members/close contacts diagnosed with the COVID 19 since your procedure?  no   If yes to any of these questions please route to Joylene John, RN and Alphonsa Gin, Therapist, sports.

## 2019-12-16 ENCOUNTER — Other Ambulatory Visit: Payer: Self-pay | Admitting: Internal Medicine

## 2019-12-16 DIAGNOSIS — Z1231 Encounter for screening mammogram for malignant neoplasm of breast: Secondary | ICD-10-CM

## 2019-12-25 DIAGNOSIS — E1122 Type 2 diabetes mellitus with diabetic chronic kidney disease: Secondary | ICD-10-CM | POA: Insufficient documentation

## 2019-12-25 DIAGNOSIS — J4520 Mild intermittent asthma, uncomplicated: Secondary | ICD-10-CM

## 2019-12-25 HISTORY — DX: Mild intermittent asthma, uncomplicated: J45.20

## 2019-12-25 HISTORY — DX: Type 2 diabetes mellitus with diabetic chronic kidney disease: E11.22

## 2019-12-26 DIAGNOSIS — E782 Mixed hyperlipidemia: Secondary | ICD-10-CM

## 2019-12-26 HISTORY — DX: Mixed hyperlipidemia: E78.2

## 2020-01-07 ENCOUNTER — Other Ambulatory Visit: Payer: Self-pay | Admitting: Internal Medicine

## 2020-01-07 DIAGNOSIS — M8580 Other specified disorders of bone density and structure, unspecified site: Secondary | ICD-10-CM

## 2020-01-08 ENCOUNTER — Ambulatory Visit: Payer: Medicare Other

## 2020-01-16 ENCOUNTER — Ambulatory Visit: Payer: Medicare PPO | Attending: Internal Medicine

## 2020-01-16 DIAGNOSIS — Z23 Encounter for immunization: Secondary | ICD-10-CM

## 2020-01-16 NOTE — Progress Notes (Signed)
   Covid-19 Vaccination Clinic  Name:  Debra Galvan    MRN: [id number] DOB: 21-Jun-1946  01/16/2020  Debra Galvan was observed post Covid-19 immunization for 15 minutes without incidence. She was provided with Vaccine Information Sheet and instruction to access the V-Safe system.   Debra Galvan was instructed to call 911 with any severe reactions post vaccine: Marland Kitchen Difficulty breathing  . Swelling of your face and throat  . A fast heartbeat  . A bad rash all over your body  . Dizziness and weakness    Immunizations Administered    Name Date Dose VIS Date Route   Pfizer COVID-19 Vaccine 01/16/2020  2:35 PM 0.3 mL 11/22/2019 Intramuscular   Manufacturer: [place]   Lot: [id number]   [place]: [id number]

## 2020-01-20 ENCOUNTER — Other Ambulatory Visit: Payer: Self-pay

## 2020-01-20 ENCOUNTER — Other Ambulatory Visit: Payer: Self-pay | Admitting: Internal Medicine

## 2020-01-20 ENCOUNTER — Ambulatory Visit
Admission: RE | Admit: 2020-01-20 | Discharge: 2020-01-20 | Disposition: A | Discharge status: Home / Self Care | Payer: Medicare PPO | Source: Ambulatory Visit | Attending: Internal Medicine | Admitting: Internal Medicine

## 2020-01-20 DIAGNOSIS — M79661 Pain in right lower leg: Secondary | ICD-10-CM

## 2020-01-24 ENCOUNTER — Other Ambulatory Visit: Payer: Self-pay

## 2020-01-24 ENCOUNTER — Ambulatory Visit
Admission: RE | Admit: 2020-01-24 | Discharge: 2020-01-24 | Disposition: A | Discharge status: Home / Self Care | Payer: Medicare PPO | Source: Ambulatory Visit | Attending: Internal Medicine | Admitting: Internal Medicine

## 2020-01-24 DIAGNOSIS — Z1231 Encounter for screening mammogram for malignant neoplasm of breast: Secondary | ICD-10-CM

## 2020-01-28 ENCOUNTER — Other Ambulatory Visit: Payer: Self-pay | Admitting: Internal Medicine

## 2020-01-28 DIAGNOSIS — M79661 Pain in right lower leg: Secondary | ICD-10-CM

## 2020-01-29 ENCOUNTER — Ambulatory Visit: Payer: Medicare Other

## 2020-01-29 ENCOUNTER — Other Ambulatory Visit: Payer: Self-pay | Admitting: Internal Medicine

## 2020-01-29 DIAGNOSIS — M79661 Pain in right lower leg: Secondary | ICD-10-CM

## 2020-02-11 ENCOUNTER — Ambulatory Visit: Payer: Medicare PPO | Attending: Internal Medicine

## 2020-02-11 DIAGNOSIS — Z23 Encounter for immunization: Secondary | ICD-10-CM | POA: Insufficient documentation

## 2020-02-11 NOTE — Progress Notes (Signed)
   Covid-19 Vaccination Clinic  Name:  Nelle Moustafa    MRN: [id number] DOB: 05-Jul-1946  02/11/2020  Ms. Huffstetler was observed post Covid-19 immunization for 15 minutes without incident. She was provided with Vaccine Information Sheet and instruction to access the V-Safe system.   Ms. Milliken was instructed to call 911 with any severe reactions post vaccine: Marland Kitchen Difficulty breathing  . Swelling of face and throat  . A fast heartbeat  . A bad rash all over body  . Dizziness and weakness   Immunizations Administered    Name Date Dose VIS Date Route   Pfizer COVID-19 Vaccine 02/11/2020  8:42 AM 0.3 mL 11/22/2019 Intramuscular   Manufacturer: [place]   Lot: [id number]   [place]: [id number]

## 2020-02-22 ENCOUNTER — Ambulatory Visit
Admission: RE | Admit: 2020-02-22 | Discharge: 2020-02-22 | Disposition: A | Discharge status: Home / Self Care | Payer: Medicare PPO | Source: Ambulatory Visit | Attending: Internal Medicine | Admitting: Internal Medicine

## 2020-02-22 DIAGNOSIS — M79661 Pain in right lower leg: Secondary | ICD-10-CM

## 2020-03-18 ENCOUNTER — Ambulatory Visit
Admission: RE | Admit: 2020-03-18 | Discharge: 2020-03-18 | Disposition: A | Discharge status: Home / Self Care | Payer: Medicare Other | Source: Ambulatory Visit | Attending: Internal Medicine | Admitting: Internal Medicine

## 2020-03-18 ENCOUNTER — Other Ambulatory Visit: Payer: Self-pay

## 2020-03-18 DIAGNOSIS — M8580 Other specified disorders of bone density and structure, unspecified site: Secondary | ICD-10-CM

## 2020-09-10 ENCOUNTER — Other Ambulatory Visit: Payer: Self-pay | Admitting: Internal Medicine

## 2020-09-10 DIAGNOSIS — E782 Mixed hyperlipidemia: Secondary | ICD-10-CM

## 2020-09-24 ENCOUNTER — Other Ambulatory Visit: Payer: Medicare PPO

## 2020-09-25 ENCOUNTER — Ambulatory Visit
Admission: RE | Admit: 2020-09-25 | Discharge: 2020-09-25 | Disposition: A | Discharge status: Home / Self Care | Payer: Medicare PPO | Source: Ambulatory Visit | Attending: Internal Medicine | Admitting: Internal Medicine

## 2020-09-25 DIAGNOSIS — E782 Mixed hyperlipidemia: Secondary | ICD-10-CM

## 2020-10-13 NOTE — Progress Notes (Signed)
Dortha Kern MD Reason for referral-coronary artery disease and hyperlipidemia  HPI: 74 year old female for evaluation of coronary artery disease and hyperlipidemia at request of Cristie Hem MD. Echocardiogram August 2020 showed normal LV function, grade 1 diastolic dysfunction and mild left atrial enlargement.  Calcium score October 2021 3 (31st percentile); aortic atherosclerosis also noted.  Patient has dyspnea with more vigorous activities but not routine activities.  No orthopnea, PND, exertional chest pain or syncope.  She has some pain in her legs with standing but does not have claudication symptoms.  Most recent cholesterol on September 03, 2020 showed total 250 with HDL 39 and LDL 156.  Cardiology now asked to evaluate.  Current Outpatient Medications  Medication Sig Dispense Refill  . acetaminophen (TYLENOL) 500 MG tablet Take 500 mg by mouth every 6 (six) hours as needed.    Marland Kitchen albuterol (PROVENTIL HFA;VENTOLIN HFA) 108 (90 BASE) MCG/ACT inhaler Inhale into the lungs every 6 (six) hours as needed for wheezing or shortness of breath.    Marland Kitchen atenolol-chlorthalidone (TENORETIC) 100-25 MG per tablet Take 1 tablet by mouth daily.    . calcium-vitamin D (OSCAL WITH D) 250-125 MG-UNIT tablet Take 1 tablet by mouth daily.    . Capsaicin-Menthol (SALONPAS PAIN REL GEL-PTCH HOT EX) Apply topically.    . [employer] Lancets Micro Thin 33G MISC daily. for testing    . losartan (COZAAR) 100 MG tablet Take 100 mg by mouth daily.    Marland Kitchen OVER THE COUNTER MEDICATION Hemp freeze cream    . TRUETRACK TEST test strip CHECK DAILY BLOOD SUGAR    . VITAMIN D PO Take 400 Units by mouth daily.    . rosuvastatin (CRESTOR) 10 MG tablet Take 1 tablet (10 mg total) by mouth daily. 90 tablet 3   Current Facility-Administered Medications  Medication Dose Route Frequency Provider Last Rate Last Admin  . 0.9 %  sodium chloride infusion  500 mL Intravenous Once Armbruster, Carlota Raspberry, MD        Allergies  Allergen  Reactions  . Prednisone     REACTION: Hyperactivity     Past Medical History:  Diagnosis Date  . Allergy   . Anemia   . Arthritis    shoulder  . Asthma   . Cancer ([place])    skin cancer  . Cataract    small forming   . Diabetes mellitus ([place])   . Diverticulosis   . GERD (gastroesophageal reflux disease)   . Heart murmur   . Hyperlipidemia   . Hypertension   . Osteoporosis     Past Surgical History:  Procedure Laterality Date  . ABDOMINAL HYSTERECTOMY    . APPENDECTOMY    . COLONOSCOPY    . POLYPECTOMY  2005   TA polyp    Social History   Socioeconomic History  . Marital status: Married    Spouse name: Not on file  . Number of children: 3  . Years of education: Not on file  . Highest education level: Not on file  Occupational History  . Not on file  Tobacco Use  . Smoking status: Never Smoker  . Smokeless tobacco: Never Used  Substance and Sexual Activity  . Alcohol use: Yes    Alcohol/week: 0.0 standard drinks    Comment: 1 glass a month - rare   . Drug use: Never  . Sexual activity: Not on file  Other Topics Concern  . Not on file  Social History Narrative  . Not on file  Social Determinants of Health   Financial Resource Strain:   . Difficulty of Paying Living Expenses: Not on file  Food Insecurity:   . Worried About Charity fundraiser in the Last Year: Not on file  . Ran Out of Food in the Last Year: Not on file  Transportation Needs:   . Lack of Transportation (Medical): Not on file  . Lack of Transportation (Non-Medical): Not on file  Physical Activity:   . Days of Exercise per Week: Not on file  . Minutes of Exercise per Session: Not on file  Stress:   . Feeling of Stress : Not on file  Social Connections:   . Frequency of Communication with Friends and Family: Not on file  . Frequency of Social Gatherings with Friends and Family: Not on file  . Attends Religious Services: Not on file  . Active Member of Clubs or Organizations: Not  on file  . Attends Archivist Meetings: Not on file  . Marital Status: Not on file  Intimate Partner Violence:   . Fear of Current or Ex-Partner: Not on file  . Emotionally Abused: Not on file  . Physically Abused: Not on file  . Sexually Abused: Not on file    Family History  Problem Relation Age of Onset  . Stroke Mother   . Stroke Father   . Heart disease Father   . Hypertension Father   . Colon cancer Maternal Grandfather   . Breast cancer Neg Hx   . Colon polyps Neg Hx   . Esophageal cancer Neg Hx   . Stomach cancer Neg Hx   . Rectal cancer Neg Hx     ROS: no fevers or chills, productive cough, hemoptysis, dysphasia, odynophagia, melena, hematochezia, dysuria, hematuria, rash, seizure activity, orthopnea, PND, pedal edema, claudication. Remaining systems are negative.  Physical Exam:   Blood pressure 136/78, pulse 60, height 5' 4.5" (1.638 m), weight 179 lb 3.2 oz (81.3 kg), SpO2 97 %.  General:  Well developed/well nourished in NAD Skin warm/dry Patient not depressed No peripheral clubbing Back-normal HEENT-normal/normal eyelids Neck supple/normal carotid upstroke bilaterally; no bruits; no JVD; no thyromegaly chest - CTA/ normal expansion CV - RRR/normal S1 and S2; no murmurs, rubs or gallops;  PMI nondisplaced Abdomen -NT/ND, no HSM, no mass, + bowel sounds, no bruit 2+ femoral pulses, no bruits Ext-no edema, chords, 2+ DP Neuro-grossly nonfocal  ECG -sinus rhythm at a rate of 60, left anterior fascicular block, incomplete right bundle branch block, poor R wave progression.  Personally reviewed  A/P  1 coronary artery disease-patient denies chest pain.  Calcium score minimally elevated and she also was noted to have atherosclerosis in her aorta.  I would like to for her to be on a statin long-term.  She has not tolerated Lipitor, pravastatin or lovastatin in the past due to myalgias.  We will try Crestor 10 mg daily.  Check lipids and liver in 12  weeks.  2 hypertension-blood pressure controlled.  Continue present medications and follow.  3 hyperlipidemia-as outlined above she did not tolerate Lipitor, pravastatin or lovastatin.  Begin Crestor 10 mg daily.  Check lipids and liver in 12 weeks.  If she does not tolerate will consider Zetia, Repatha or Praluent in the future.  She is somewhat hesitant to take cholesterol medications.  Kirk Ruths, MD

## 2020-10-21 ENCOUNTER — Ambulatory Visit (INDEPENDENT_AMBULATORY_CARE_PROVIDER_SITE_OTHER): Payer: Medicare PPO | Admitting: Cardiology

## 2020-10-21 ENCOUNTER — Encounter: Payer: Self-pay | Admitting: Cardiology

## 2020-10-21 ENCOUNTER — Other Ambulatory Visit: Payer: Self-pay

## 2020-10-21 VITALS — BP 136/78 | HR 60 | Ht 64.5 in | Wt 179.2 lb

## 2020-10-21 DIAGNOSIS — I2510 Atherosclerotic heart disease of native coronary artery without angina pectoris: Secondary | ICD-10-CM | POA: Diagnosis not present

## 2020-10-21 DIAGNOSIS — E7800 Pure hypercholesterolemia, unspecified: Secondary | ICD-10-CM | POA: Diagnosis not present

## 2020-10-21 DIAGNOSIS — I10 Essential (primary) hypertension: Secondary | ICD-10-CM | POA: Diagnosis not present

## 2020-10-21 MED ORDER — ROSUVASTATIN CALCIUM 10 MG PO TABS: 10.0000 mg | ORAL_TABLET | Freq: Every day | ORAL | 3 refills | Status: DC

## 2020-10-21 NOTE — Patient Instructions (Signed)
Medication Instructions:   START ROSUVASTATIN 10 MG ONCE DAILY  *If you need a refill on your cardiac medications before your next appointment, please call your pharmacy*   Lab Work:  Your physician recommends that you return for lab work FASTING IN 12 WEEKS  If you have labs (blood work) drawn today and your tests are completely normal, you will receive your results only by: Marland Kitchen MyChart Message (if you have MyChart) OR . A paper copy in the mail If you have any lab test that is abnormal or we need to change your treatment, we will call you to review the results.   Follow-Up: At [facility], you and your health needs are our priority.  As part of our continuing mission to provide you with exceptional heart care, we have created designated Provider Care Teams.  These Care Teams include your primary Cardiologist (physician) and Advanced Practice Providers (APPs -  Physician Assistants and Nurse Practitioners) who all work together to provide you with the care you need, when you need it.  We recommend signing up for the patient portal called "MyChart".  Sign up information is provided on this After Visit Summary.  MyChart is used to connect with patients for Virtual Visits (Telemedicine).  Patients are able to view lab/test results, encounter notes, upcoming appointments, etc.  Non-urgent messages can be sent to your provider as well.   To learn more about what you can do with MyChart, go to [email or web address].    Your next appointment:   12 month(s)  The format for your next appointment:   In Person  Provider:   Kirk Ruths, MD

## 2021-01-13 ENCOUNTER — Other Ambulatory Visit: Payer: Self-pay | Admitting: Internal Medicine

## 2021-01-13 DIAGNOSIS — Z1231 Encounter for screening mammogram for malignant neoplasm of breast: Secondary | ICD-10-CM

## 2021-02-15 ENCOUNTER — Encounter: Payer: Self-pay | Admitting: *Deleted

## 2021-03-01 ENCOUNTER — Ambulatory Visit
Admission: RE | Admit: 2021-03-01 | Discharge: 2021-03-01 | Disposition: A | Discharge status: Home / Self Care | Payer: Medicare PPO | Source: Ambulatory Visit | Attending: Internal Medicine | Admitting: Internal Medicine

## 2021-03-01 ENCOUNTER — Other Ambulatory Visit: Payer: Self-pay

## 2021-03-01 DIAGNOSIS — Z1231 Encounter for screening mammogram for malignant neoplasm of breast: Secondary | ICD-10-CM

## 2021-03-08 ENCOUNTER — Other Ambulatory Visit: Payer: Self-pay

## 2021-03-08 ENCOUNTER — Emergency Department (HOSPITAL_COMMUNITY): Payer: Medicare PPO

## 2021-03-08 ENCOUNTER — Inpatient Hospital Stay (HOSPITAL_COMMUNITY)
Admission: EM | Admit: 2021-03-08 | Discharge: 2021-03-10 | DRG: 392 | Disposition: A | Discharge status: Home / Self Care | Payer: Medicare PPO | Attending: Internal Medicine | Admitting: Internal Medicine

## 2021-03-08 ENCOUNTER — Encounter (HOSPITAL_COMMUNITY): Payer: Self-pay

## 2021-03-08 DIAGNOSIS — E669 Obesity, unspecified: Secondary | ICD-10-CM

## 2021-03-08 DIAGNOSIS — E119 Type 2 diabetes mellitus without complications: Secondary | ICD-10-CM | POA: Diagnosis present

## 2021-03-08 DIAGNOSIS — Z8249 Family history of ischemic heart disease and other diseases of the circulatory system: Secondary | ICD-10-CM | POA: Diagnosis not present

## 2021-03-08 DIAGNOSIS — E785 Hyperlipidemia, unspecified: Secondary | ICD-10-CM | POA: Diagnosis present

## 2021-03-08 DIAGNOSIS — Z20822 Contact with and (suspected) exposure to covid-19: Secondary | ICD-10-CM | POA: Diagnosis present

## 2021-03-08 DIAGNOSIS — M810 Age-related osteoporosis without current pathological fracture: Secondary | ICD-10-CM | POA: Diagnosis present

## 2021-03-08 DIAGNOSIS — A09 Infectious gastroenteritis and colitis, unspecified: Principal | ICD-10-CM | POA: Diagnosis present

## 2021-03-08 DIAGNOSIS — Z800 Family history of malignant neoplasm of digestive organs: Secondary | ICD-10-CM

## 2021-03-08 DIAGNOSIS — Z823 Family history of stroke: Secondary | ICD-10-CM | POA: Diagnosis not present

## 2021-03-08 DIAGNOSIS — E860 Dehydration: Secondary | ICD-10-CM | POA: Diagnosis present

## 2021-03-08 DIAGNOSIS — E1169 Type 2 diabetes mellitus with other specified complication: Secondary | ICD-10-CM | POA: Diagnosis present

## 2021-03-08 DIAGNOSIS — Z79899 Other long term (current) drug therapy: Secondary | ICD-10-CM

## 2021-03-08 DIAGNOSIS — D72829 Elevated white blood cell count, unspecified: Secondary | ICD-10-CM | POA: Diagnosis present

## 2021-03-08 DIAGNOSIS — I10 Essential (primary) hypertension: Secondary | ICD-10-CM

## 2021-03-08 DIAGNOSIS — Z6830 Body mass index (BMI) 30.0-30.9, adult: Secondary | ICD-10-CM

## 2021-03-08 DIAGNOSIS — K529 Noninfective gastroenteritis and colitis, unspecified: Secondary | ICD-10-CM

## 2021-03-08 DIAGNOSIS — J4520 Mild intermittent asthma, uncomplicated: Secondary | ICD-10-CM | POA: Diagnosis present

## 2021-03-08 DIAGNOSIS — N179 Acute kidney failure, unspecified: Secondary | ICD-10-CM | POA: Diagnosis present

## 2021-03-08 HISTORY — DX: Noninfective gastroenteritis and colitis, unspecified: K52.9

## 2021-03-08 HISTORY — DX: Essential (primary) hypertension: I10

## 2021-03-08 HISTORY — DX: Type 2 diabetes mellitus with other specified complication: E66.9

## 2021-03-08 HISTORY — DX: Elevated white blood cell count, unspecified: D72.829

## 2021-03-08 LAB — COMPREHENSIVE METABOLIC PANEL
ALT: 19 U/L (ref 0–44)
AST: 22 U/L (ref 15–41)
Albumin: 4.4 g/dL (ref 3.5–5.0)
Alkaline Phosphatase: 63 U/L (ref 38–126)
Anion gap: 11 (ref 5–15)
BUN: 35 mg/dL — ABNORMAL HIGH (ref 8–23)
CO2: 27 mmol/L (ref 22–32)
Calcium: 9.8 mg/dL (ref 8.9–10.3)
Chloride: 97 mmol/L — ABNORMAL LOW (ref 98–111)
Creatinine, Ser: 1.18 mg/dL — ABNORMAL HIGH (ref 0.44–1.00)
GFR, Estimated: 48 mL/min — ABNORMAL LOW (ref >60)
Glucose, Bld: 146 mg/dL — ABNORMAL HIGH (ref 70–99)
Potassium: 3.4 mmol/L — ABNORMAL LOW (ref 3.5–5.1)
Sodium: 135 mmol/L (ref 135–145)
Total Bilirubin: 1 mg/dL (ref 0.3–1.2)
Total Protein: 8.5 g/dL — ABNORMAL HIGH (ref 6.5–8.1)

## 2021-03-08 LAB — CBC
HCT: 43.2 % (ref 36.0–46.0)
Hemoglobin: 14.3 g/dL (ref 12.0–15.0)
MCH: 30.6 pg (ref 26.0–34.0)
MCHC: 33.1 g/dL (ref 30.0–36.0)
MCV: 92.5 fL (ref 80.0–100.0)
Platelets: 305 10*3/uL (ref 150–400)
RBC: 4.67 MIL/uL (ref 3.87–5.11)
RDW: 12.2 % (ref 11.5–15.5)
WBC: 23.4 10*3/uL — ABNORMAL HIGH (ref 4.0–10.5)
nRBC: 0 % (ref 0.0–0.2)

## 2021-03-08 LAB — URINALYSIS, ROUTINE W REFLEX MICROSCOPIC
Bacteria, UA: NONE SEEN
Bilirubin Urine: NEGATIVE
Glucose, UA: NEGATIVE mg/dL
Ketones, ur: 5 mg/dL — AB
Leukocytes,Ua: NEGATIVE
Nitrite: NEGATIVE
Protein, ur: NEGATIVE mg/dL
Specific Gravity, Urine: 1.024 (ref 1.005–1.030)
pH: 5 (ref 5.0–8.0)

## 2021-03-08 LAB — CBC WITH DIFFERENTIAL/PLATELET
Abs Immature Granulocytes: 0.1 10*3/uL — ABNORMAL HIGH (ref 0.00–0.07)
Basophils Absolute: 0.1 10*3/uL (ref 0.0–0.1)
Basophils Relative: 0 %
Eosinophils Absolute: 0 10*3/uL (ref 0.0–0.5)
Eosinophils Relative: 0 %
HCT: 38.8 % (ref 36.0–46.0)
Hemoglobin: 12.9 g/dL (ref 12.0–15.0)
Immature Granulocytes: 1 %
Lymphocytes Relative: 7 %
Lymphs Abs: 1.5 10*3/uL (ref 0.7–4.0)
MCH: 30.6 pg (ref 26.0–34.0)
MCHC: 33.2 g/dL (ref 30.0–36.0)
MCV: 91.9 fL (ref 80.0–100.0)
Monocytes Absolute: 0.9 10*3/uL (ref 0.1–1.0)
Monocytes Relative: 4 %
Neutro Abs: 18 10*3/uL — ABNORMAL HIGH (ref 1.7–7.7)
Neutrophils Relative %: 88 %
Platelets: 260 10*3/uL (ref 150–400)
RBC: 4.22 MIL/uL (ref 3.87–5.11)
RDW: 12.2 % (ref 11.5–15.5)
WBC: 20.5 10*3/uL — ABNORMAL HIGH (ref 4.0–10.5)
nRBC: 0 % (ref 0.0–0.2)

## 2021-03-08 LAB — LIPASE, BLOOD: Lipase: 27 U/L (ref 11–51)

## 2021-03-08 LAB — POC OCCULT BLOOD, ED: Fecal Occult Bld: POSITIVE — AB

## 2021-03-08 MED ORDER — ACETAMINOPHEN 650 MG RE SUPP: 650.0000 mg | Freq: Four times a day (QID) | RECTAL | Status: DC | PRN

## 2021-03-08 MED ORDER — ACETAMINOPHEN 325 MG PO TABS: 650.0000 mg | ORAL_TABLET | Freq: Four times a day (QID) | ORAL | Status: DC | PRN

## 2021-03-08 MED ORDER — ONDANSETRON HCL 4 MG/2ML IJ SOLN: 4.0000 mg | Freq: Four times a day (QID) | INTRAMUSCULAR | Status: DC | PRN

## 2021-03-08 MED ORDER — PIPERACILLIN-TAZOBACTAM 3.375 G IVPB 30 MIN
3.3750 g | INTRAVENOUS | Status: AC
  Administered 2021-03-08: 3.375 g via INTRAVENOUS
  Filled 2021-03-08: qty 50

## 2021-03-08 MED ORDER — ONDANSETRON HCL 4 MG PO TABS: 4.0000 mg | ORAL_TABLET | Freq: Four times a day (QID) | ORAL | Status: DC | PRN

## 2021-03-08 MED ORDER — IOHEXOL 300 MG/ML  SOLN
80.0000 mL | Freq: Once | INTRAMUSCULAR | Status: AC | PRN
  Administered 2021-03-08: 80 mL via INTRAVENOUS

## 2021-03-08 MED ORDER — PIPERACILLIN-TAZOBACTAM 4.5 G IVPB: 4.5000 g | Freq: Once | INTRAVENOUS | Status: DC

## 2021-03-08 MED ORDER — ALBUTEROL SULFATE HFA 108 (90 BASE) MCG/ACT IN AERS: 2.0000 | INHALATION_SPRAY | Freq: Four times a day (QID) | RESPIRATORY_TRACT | Status: DC | PRN

## 2021-03-08 NOTE — H&P (Signed)
History and Physical    Debra WCH:[id number] DOB: 12-30-1945 DOA: 03/08/2021  PCP: Michael Boston, MD   Patient coming from: Home  Chief Complaint: Abdominal pain and diarrhea  HPI: Debra is a 75 y.o. female with medical history significant for HTN, DMT2 diet controlled, mild intermittent asthma, HLD who presents for evaluation of sudden onset of diarrhea and abdominal pain this afternoon. She reports that around 3 pm this afternoon she developed a sudden onset of lower abdominal pain that she reports was a sharp crampy feeling that did not radiate.  Few minutes later she had profuse diarrhea which is associated with chills and some diaphoresis.  She denies bright red blood in the bowel movement.  She reports having multiple bouts of watery diarrhea after that.  She was at a store with her husband when the symptoms began and she spent 30 minutes in the bathroom.  After going home she laid down for a little bit but continued to have abdominal pain so she decided to come for evaluation.  Is not had any fever.  Denies any vomiting although she did have some nausea with the diarrhea.  She denies any recent travel or change in her diet.  She denies consuming any seafood or raw or undercooked meats.  She has not been on antibiotics in the last 3 months.  She has had 3 colonoscopies in the past with the last one being a year and a half ago.  She reports has been told she has diverticulosis but no other pathology.  Lives with her husband.  Denies tobacco, alcohol, illicit drug use.  ED Course: In the emergency room patient is found to have an elevated white blood cell count of greater than 23,000.  CT of the abdomen and pelvis shows segment of colitis which could be infectious versus inflammatory per radiology read.  She has been hemodynamically stable.  Review of Systems:  General: Denies fever, weight loss, night sweats.  Denies dizziness.  HENT: Denies head trauma, headache, denies  change in hearing, tinnitus. Denies nasal bleeding. Denies sore throat, sores in mouth.  Denies difficulty swallowing Eyes: Denies blurry vision, pain in eye, drainage.  Denies discoloration of eyes. Neck: Denies pain.  Denies swelling.  Denies pain with movement. Cardiovascular: Denies chest pain, palpitations.  Denies edema.  Denies orthopnea Respiratory: Denies shortness of breath, cough.  Denies wheezing.  Denies sputum production Gastrointestinal: Reports abdominal pain, diarrhea and nausea but no vomiting, Denies melena.  Denies hematemesis. Musculoskeletal: Denies limitation of movement.  Denies deformity or swelling.  Denies pain.  Denies arthralgias or myalgias. Genitourinary: Denies pelvic pain.  Denies urinary frequency or hesitancy.   Skin: Denies rash.  Denies petechiae, purpura, ecchymosis. Neurological: Denies headache. Denies syncope. Denies seizure activity. Denies paresthesia. Denies slurred speech, drooping face.  Denies visual change. Psychiatric: Denies depression, anxiety.  Denies hallucinations.  Past Medical History:  Diagnosis Date  . Allergy   . Anemia   . Arthritis    shoulder  . Asthma   . Cancer ([place])    skin cancer  . Cataract    small forming   . Diabetes mellitus ([place])   . Diverticulosis   . GERD (gastroesophageal reflux disease)   . Heart murmur   . Hyperlipidemia   . Hypertension   . Osteoporosis     Past Surgical History:  Procedure Laterality Date  . ABDOMINAL HYSTERECTOMY    . APPENDECTOMY    . COLONOSCOPY    .  POLYPECTOMY  2005   TA polyp    Social History  reports that she has never smoked. She has never used smokeless tobacco. She reports current alcohol use. She reports that she does not use drugs.  Allergies  Allergen Reactions  . Prednisone Other (See Comments)    REACTION: Hyperactivity    Family History  Problem Relation Age of Onset  . Stroke Mother   . Stroke Father   . Heart disease Father   . Hypertension Father    . Colon cancer Maternal Grandfather   . Breast cancer Neg Hx   . Colon polyps Neg Hx   . Esophageal cancer Neg Hx   . Stomach cancer Neg Hx   . Rectal cancer Neg Hx      Prior to Admission medications   Medication Sig Start Date End Date Taking? Authorizing Provider  acetaminophen (TYLENOL) 500 MG tablet Take 500 mg by mouth every 6 (six) hours as needed.    [provider]  albuterol (PROVENTIL HFA;VENTOLIN HFA) 108 (90 BASE) MCG/ACT inhaler Inhale into the lungs every 6 (six) hours as needed for wheezing or shortness of breath.    [provider]  atenolol-chlorthalidone (TENORETIC) 100-25 MG per tablet Take 1 tablet by mouth daily.    [provider]  calcium-vitamin D (OSCAL WITH D) 250-125 MG-UNIT tablet Take 1 tablet by mouth daily.    [provider]  Capsaicin-Menthol (SALONPAS PAIN REL GEL-PTCH HOT EX) Apply topically.    [provider]  [employer] Lancets Micro Thin 33G MISC daily. for testing 07/29/19   [provider]  losartan (COZAAR) 100 MG tablet Take 100 mg by mouth daily.    [provider]  OVER THE COUNTER MEDICATION Hemp freeze cream    [provider]  rosuvastatin (CRESTOR) 10 MG tablet Take 1 tablet (10 mg total) by mouth daily. 10/21/20 01/19/21  Lelon Perla, MD  TRUETRACK TEST test strip CHECK DAILY BLOOD SUGAR 07/29/19   [provider]  VITAMIN D PO Take 400 Units by mouth daily.    [provider]    Physical Exam: Vitals:   03/08/21 2125 03/08/21 2200 03/08/21 2300 03/08/21 2321  BP: (!) 151/70 (!) 157/74 (!) 174/74 (!) 174/74  Pulse: 69 (!) 56 (!) 56 92  Resp: 18 18  18   Temp: 97.7 F (36.5 C)     TempSrc:      SpO2: 97% 98% 98% 100%    Constitutional: NAD, calm, comfortable Vitals:   03/08/21 2125 03/08/21 2200 03/08/21 2300 03/08/21 2321  BP: (!) 151/70 (!) 157/74 (!) 174/74 (!) 174/74  Pulse: 69 (!) 56 (!) 56 92  Resp: 18 18  18   Temp: 97.7 F (36.5 C)      TempSrc:      SpO2: 97% 98% 98% 100%   General: WDWN, Alert and oriented x3.  Eyes: EOMI, PERRL, conjunctivae normal.  Sclera nonicteric HENT:  [place]/AT, external ears normal.  Nares patent without epistasis.  Mucous membranes are dry Neck: Soft, normal range of motion, supple, no masses, no thyromegaly. Trachea midline Respiratory: clear to auscultation bilaterally, no wheezing, no crackles. Normal respiratory effort. No accessory muscle use.  Cardiovascular: Regular rate and rhythm, no murmurs / rubs / gallops. No extremity edema. 1+ pedal pulses.  Abdomen: Soft, mild lower abdominal tenderness, nondistended, no rebound or guarding.  Obese. No masses palpated. Bowel sounds hyperactive. No CVA tenderness to percussion Musculoskeletal: FROM. no cyanosis. No joint deformity upper and lower  extremities. Normal muscle tone.  Skin: Warm, dry, intact no rashes, lesions, ulcers. No induration Neurologic: CN 2-12 grossly intact.  Normal speech.  Sensation intact. Strength 5/5 in all extremities.   Psychiatric: Normal judgment and insight.  Normal mood.    Labs on Admission: I have personally reviewed following labs and imaging studies  CBC: Recent Labs  Lab 03/08/21 1944 03/08/21 2305  WBC 23.4* 20.5*  NEUTROABS  --  18.0*  HGB 14.3 12.9  HCT 43.2 38.8  MCV 92.5 91.9  PLT 305 585    Basic Metabolic Panel: Recent Labs  Lab 03/08/21 1944  NA 135  K 3.4*  CL 97*  CO2 27  GLUCOSE 146*  BUN 35*  CREATININE 1.18*  CALCIUM 9.8    GFR: CrCl cannot be calculated (Unknown ideal weight.).  Liver Function Tests: Recent Labs  Lab 03/08/21 1944  AST 22  ALT 19  ALKPHOS 63  BILITOT 1.0  PROT 8.5*  ALBUMIN 4.4    Urine analysis:    Component Value Date/Time   COLORURINE YELLOW 03/08/2021 1944   APPEARANCEUR CLEAR 03/08/2021 1944   LABSPEC 1.024 03/08/2021 1944   PHURINE 5.0 03/08/2021 1944   GLUCOSEU NEGATIVE 03/08/2021 1944   HGBUR SMALL (A) 03/08/2021 1944    BILIRUBINUR NEGATIVE 03/08/2021 1944   KETONESUR 5 (A) 03/08/2021 1944   PROTEINUR NEGATIVE 03/08/2021 1944   NITRITE NEGATIVE 03/08/2021 1944   LEUKOCYTESUR NEGATIVE 03/08/2021 1944    Radiological Exams on Admission: CT Abdomen Pelvis W Contrast  Result Date: 03/08/2021 CLINICAL DATA:  Lower abdominal pain, dysuria, left lower quadrant abdominal pain, diarrhea EXAM: CT ABDOMEN AND PELVIS WITH CONTRAST TECHNIQUE: Multidetector CT imaging of the abdomen and pelvis was performed using the standard protocol following bolus administration of intravenous contrast. CONTRAST:  55mL OMNIPAQUE IOHEXOL 300 MG/ML  SOLN COMPARISON:  None. FINDINGS: Lower chest: The visualized lung bases are clear bilaterally. The visualized heart and pericardium are unremarkable. Hepatobiliary: No focal liver abnormality is seen. No gallstones, gallbladder wall thickening, or biliary dilatation. Pancreas: Unremarkable Spleen: Unremarkable Adrenals/Urinary Tract: Adrenal glands are unremarkable. Kidneys are normal, without renal calculi, focal lesion, or hydronephrosis. Bladder is unremarkable. Stomach/Bowel: The stomach and small bowel are unremarkable. The appendix is absent. There is a 7 mm hyperdense nodule within the a left pericolic gutter inferiorly adjacent to the cecum with a small amount of surrounding inflammatory change. This may represent postsurgical change related to prior appendectomy. Alternatively, a residual appendicular lith with surrounding inflammatory change could result in such an appearance. There is circumferential wall thickening and mild pericolonic inflammatory stranding involving the descending and sigmoid colon in keeping with infectious, inflammatory, or, given the distribution, ischemic colitis. Mild superimposed sigmoid diverticulosis. No evidence of obstruction or perforation. No free intraperitoneal gas or fluid. The central mesenteric arterial and venous vasculature appears patent.  Vascular/Lymphatic: Moderate aortic atherosclerotic calcification. As noted above, the mesenteric vasculature appears unremarkable on this non arteriographic study. Retroaortic left renal vein. No pathologic adenopathy within the abdomen and pelvis. Reproductive: Status post hysterectomy. No adnexal masses. Other: Tiny fat containing umbilical hernia. The rectum is unremarkable. Musculoskeletal: Degenerative changes are seen within the lumbar spine. No lytic or blastic bone lesion is identified. IMPRESSION: Long segment inflammatory changes involving the descending and sigmoid colon in keeping with an infectious or inflammatory colitis. Given the distribution, ischemic colitis should also be considered though the central mesenteric vasculature appears patent on this examination. No evidence of obstruction or perforation. 7 mm hyperdense nodule within the  right pericolic gutter adjacent to the cecum possibly the sequela of prior appendectomy. A retained appendicolith with surrounding inflammatory change could result in such an appearance. Aortic Atherosclerosis (ICD10-I70.0). Electronically Signed   By: Fidela Salisbury MD   On: 03/08/2021 22:45    Assessment/Plan Principal Problem:   Colitis Debra Galvan is admitted to med/surg floor.  She has been started on Zosyn for antibiotic coverage for infectious colitis based upon CT scan and elevated WBC greater than 23,000. Stool sample for C. difficile and GI viral panel has been ordered in the emergency room and is pending.  If C. difficile is positive will treat accordingly IVF hydration with LR 125 ml/hr  Active Problems:   AKI (acute kidney injury)  IVF hydration overnight. AKI secondary to volume depletion from diarrhea.     Essential hypertension Continue home dose of losartan and tenoretic. Monitor BP.     Diabetes mellitus type 2 in obese  Pt has diet controlled DMT2. Recheck blood sugar with labs in am Check HgbA1c    Leukocytosis Monitor with  CBC in am.     DVT prophylaxis: Lovenox for DVT prophylaxis Code Status:   Full Code Family Communication:  Diagnosis and plan discussed with patient and her husband who is at bedside.  Questions answered.  They understand and agree with plan.  Further recommendations to follow as clinically indicated Disposition Plan:   Patient is from:  Home  Anticipated DC to:  Home  Anticipated DC date:  Anticipate 2 midnight stay in the hospital  Anticipated DC barriers: No barriers to discharge identified at this time   Admission status:  Inpatient   Yevonne Aline Jadelyn Elks MD [facility]  How to contact the [facility] Attending or Consulting provider [place] or covering provider during after hours [place], for this patient?   1. Check the care team in [facility] and look for a) attending/consulting [facility] provider listed and b) the [facility] team listed 2. Log into [email or web address] and use [place]'s universal password to access. If you do not have the password, please contact the hospital operator. 3. Locate the [facility] provider you are looking for under [facility] and page to a number that you can be directly reached. 4. If you still have difficulty reaching the provider, please page the [facility] (Director on Call) for the Hospitalists listed on amion for assistance.  03/08/2021, 11:37 PM

## 2021-03-08 NOTE — ED Triage Notes (Signed)
Pt sts lower abdominal pain. Mild dysuria and cramping in LLQ. Reports becoming dizzy while having a BM and diarrhea with blood present. Says she has several external hemorrhoids.

## 2021-03-08 NOTE — ED Provider Notes (Signed)
[place] DEPT Provider Note   CSN: [id number] Arrival date & time: 03/08/21  1927     History Chief Complaint  Patient presents with  . Abdominal Pain    Debra Galvan is a 75 y.o. female.  [place] states that she had the abrupt onset of lower abdominal pain.  She states that it was followed shortly thereafter by severe diarrhea, diaphoresis, and the feeling that she might pass out if she stood up.  The symptoms have happened to her in the past, but they have been short-lived.  This time, her symptoms have been somewhat persistent despite trying to lie down.  She has a history of a total abdominal hysterectomy and appendectomy.  She was told during a recent colonoscopy that she had diverticulosis.  Last colonoscopy was 1.5 years ago.  The history is provided by the patient.  Abdominal Pain Pain location:  LLQ and RLQ Pain quality: sharp and stabbing   Pain radiates to:  R flank Pain severity:  Moderate Onset quality:  Sudden Duration:  9 hours Timing:  Constant Progression:  Worsening Chronicity:  New Context: not sick contacts, not suspicious food intake and not trauma   Relieved by:  Nothing Worsened by:  Nothing Ineffective treatments:  None tried Associated symptoms: diarrhea, hematochezia and nausea   Associated symptoms: no anorexia, no chest pain, no chills, no constipation, no cough, no dysuria, no fever, no hematuria, no shortness of breath, no sore throat and no vomiting        Past Medical History:  Diagnosis Date  . Allergy   . Anemia   . Arthritis    shoulder  . Asthma   . Cancer ([place])    skin cancer  . Cataract    small forming   . Diabetes mellitus ([place])   . Diverticulosis   . GERD (gastroesophageal reflux disease)   . Heart murmur   . Hyperlipidemia   . Hypertension   . Osteoporosis     There are no problems to display for this patient.   Past Surgical History:  Procedure Laterality Date  .  ABDOMINAL HYSTERECTOMY    . APPENDECTOMY    . COLONOSCOPY    . POLYPECTOMY  2005   TA polyp     OB History   No obstetric history on file.     Family History  Problem Relation Age of Onset  . Stroke Mother   . Stroke Father   . Heart disease Father   . Hypertension Father   . Colon cancer Maternal Grandfather   . Breast cancer Neg Hx   . Colon polyps Neg Hx   . Esophageal cancer Neg Hx   . Stomach cancer Neg Hx   . Rectal cancer Neg Hx     Social History   Tobacco Use  . Smoking status: Never Smoker  . Smokeless tobacco: Never Used  Substance Use Topics  . Alcohol use: Yes    Alcohol/week: 0.0 standard drinks    Comment: 1 glass a month - rare   . Drug use: Never    Home Medications Prior to Admission medications   Medication Sig Start Date End Date Taking? Authorizing Provider  acetaminophen (TYLENOL) 500 MG tablet Take 500 mg by mouth every 6 (six) hours as needed.    [provider]  albuterol (PROVENTIL HFA;VENTOLIN HFA) 108 (90 BASE) MCG/ACT inhaler Inhale into the lungs every 6 (six) hours as needed for wheezing or shortness of breath.  [provider]  atenolol-chlorthalidone (TENORETIC) 100-25 MG per tablet Take 1 tablet by mouth daily.    [provider]  calcium-vitamin D (OSCAL WITH D) 250-125 MG-UNIT tablet Take 1 tablet by mouth daily.    [provider]  Capsaicin-Menthol (SALONPAS PAIN REL GEL-PTCH HOT EX) Apply topically.    [provider]  [employer] Lancets Micro Thin 33G MISC daily. for testing 07/29/19   [provider]  losartan (COZAAR) 100 MG tablet Take 100 mg by mouth daily.    [provider]  OVER THE COUNTER MEDICATION Hemp freeze cream    [provider]  rosuvastatin (CRESTOR) 10 MG tablet Take 1 tablet (10 mg total) by mouth daily. 10/21/20 01/19/21  Lelon Perla, MD  TRUETRACK TEST test strip CHECK DAILY BLOOD SUGAR 07/29/19   [provider]  VITAMIN D PO  Take 400 Units by mouth daily.    [provider]    Allergies    Prednisone  Review of Systems   Review of Systems  Constitutional: Negative for chills and fever.  HENT: Negative for ear pain and sore throat.   Eyes: Negative for pain and visual disturbance.  Respiratory: Negative for cough and shortness of breath.   Cardiovascular: Negative for chest pain and palpitations.  Gastrointestinal: Positive for abdominal pain, diarrhea, hematochezia and nausea. Negative for anorexia, constipation and vomiting.  Genitourinary: Negative for dysuria and hematuria.  Musculoskeletal: Negative for arthralgias and back pain.  Skin: Negative for color change and rash.  Neurological: Negative for seizures and syncope.  All other systems reviewed and are negative.   Physical Exam Updated Vital Signs BP (!) 159/82 (BP Location: Left Arm)   Pulse 86   Temp 98 F (36.7 C) (Oral)   Resp 17   SpO2 100%   Physical Exam Vitals and nursing note reviewed.  Constitutional:      General: She is not in acute distress.    Appearance: She is well-developed.  HENT:     Head: Normocephalic and atraumatic.  Eyes:     Conjunctiva/sclera: Conjunctivae normal.  Cardiovascular:     Rate and Rhythm: Normal rate and regular rhythm.     Heart sounds: No murmur heard.   Pulmonary:     Effort: Pulmonary effort is normal. No respiratory distress.     Breath sounds: Normal breath sounds.  Abdominal:     Palpations: Abdomen is soft.     Tenderness: There is abdominal tenderness in the right lower quadrant and left lower quadrant. There is right CVA tenderness.     Comments: L >R TTP  Genitourinary:    Comments: No obvious rectal abnormalities.  Stool was brown with scant streaks of bright red blood. Musculoskeletal:     Cervical back: Neck supple.  Skin:    General: Skin is warm and dry.  Neurological:     General: No focal deficit present.     Mental Status: She is alert.  Psychiatric:         Mood and Affect: Mood normal.     ED Results / Procedures / Treatments   Labs (all labs ordered are listed, but only abnormal results are displayed) Labs Reviewed  COMPREHENSIVE METABOLIC PANEL - Abnormal; Notable for the following components:      Result Value   Potassium 3.4 (*)    Chloride 97 (*)    Glucose, Bld 146 (*)    BUN 35 (*)    Creatinine, Ser 1.18 (*)  Total Protein 8.5 (*)    GFR, Estimated 48 (*)    All other components within normal limits  CBC - Abnormal; Notable for the following components:   WBC 23.4 (*)    All other components within normal limits  URINALYSIS, ROUTINE W REFLEX MICROSCOPIC - Abnormal; Notable for the following components:   Hgb urine dipstick SMALL (*)    Ketones, ur 5 (*)    All other components within normal limits  POC OCCULT BLOOD, ED - Abnormal; Notable for the following components:   Fecal Occult Bld POSITIVE (*)    All other components within normal limits  C DIFFICILE QUICK SCREEN W PCR REFLEX  GASTROINTESTINAL PANEL BY PCR, STOOL (REPLACES STOOL CULTURE)  SARS CORONAVIRUS 2 (TAT 6-24 HRS)  LIPASE, BLOOD  CBC WITH DIFFERENTIAL/PLATELET    EKG None  Radiology CT Abdomen Pelvis W Contrast  Result Date: 03/08/2021 CLINICAL DATA:  Lower abdominal pain, dysuria, left lower quadrant abdominal pain, diarrhea EXAM: CT ABDOMEN AND PELVIS WITH CONTRAST TECHNIQUE: Multidetector CT imaging of the abdomen and pelvis was performed using the standard protocol following bolus administration of intravenous contrast. CONTRAST:  59mL OMNIPAQUE IOHEXOL 300 MG/ML  SOLN COMPARISON:  None. FINDINGS: Lower chest: The visualized lung bases are clear bilaterally. The visualized heart and pericardium are unremarkable. Hepatobiliary: No focal liver abnormality is seen. No gallstones, gallbladder wall thickening, or biliary dilatation. Pancreas: Unremarkable Spleen: Unremarkable Adrenals/Urinary Tract: Adrenal glands are unremarkable. Kidneys are  normal, without renal calculi, focal lesion, or hydronephrosis. Bladder is unremarkable. Stomach/Bowel: The stomach and small bowel are unremarkable. The appendix is absent. There is a 7 mm hyperdense nodule within the a left pericolic gutter inferiorly adjacent to the cecum with a small amount of surrounding inflammatory change. This may represent postsurgical change related to prior appendectomy. Alternatively, a residual appendicular lith with surrounding inflammatory change could result in such an appearance. There is circumferential wall thickening and mild pericolonic inflammatory stranding involving the descending and sigmoid colon in keeping with infectious, inflammatory, or, given the distribution, ischemic colitis. Mild superimposed sigmoid diverticulosis. No evidence of obstruction or perforation. No free intraperitoneal gas or fluid. The central mesenteric arterial and venous vasculature appears patent. Vascular/Lymphatic: Moderate aortic atherosclerotic calcification. As noted above, the mesenteric vasculature appears unremarkable on this non arteriographic study. Retroaortic left renal vein. No pathologic adenopathy within the abdomen and pelvis. Reproductive: Status post hysterectomy. No adnexal masses. Other: Tiny fat containing umbilical hernia. The rectum is unremarkable. Musculoskeletal: Degenerative changes are seen within the lumbar spine. No lytic or blastic bone lesion is identified. IMPRESSION: Long segment inflammatory changes involving the descending and sigmoid colon in keeping with an infectious or inflammatory colitis. Given the distribution, ischemic colitis should also be considered though the central mesenteric vasculature appears patent on this examination. No evidence of obstruction or perforation. 7 mm hyperdense nodule within the right pericolic gutter adjacent to the cecum possibly the sequela of prior appendectomy. A retained appendicolith with surrounding inflammatory change  could result in such an appearance. Aortic Atherosclerosis (ICD10-I70.0). Electronically Signed   By: Fidela Salisbury MD   On: 03/08/2021 22:45    Procedures Procedures   Medications Ordered in ED Medications  piperacillin-tazobactam (ZOSYN) IVPB 3.375 g (has no administration in time range)  iohexol (OMNIPAQUE) 300 MG/ML solution 80 mL (80 mLs Intravenous Contrast Given 03/08/21 2222)    ED Course  I have reviewed the triage vital signs and the nursing notes.  Pertinent labs & imaging results that were available  during my care of the patient were reviewed by me and considered in my medical decision making (see chart for details).   I discussed the case with Dr. Tonie Griffith. MDM Rules/Calculators/A&P                          [place] presented with severe lower abdominal pain and diarrhea.  She was mildly hypertensive, but otherwise her vitals were normal.  She was evaluated for evidence of diverticulitis, intestinal obstruction, renal colic, infectious, or inflammatory colitis.  She was found to have colonic inflammation.  Presentation is more consistent with infection than it is for ischemia.  She was given empiric antibiotics and will be admitted for further treatment. Final Clinical Impression(s) / ED Diagnoses Final diagnoses:  Colitis    Rx / DC Orders ED Discharge Orders    None       Arnaldo Natal, MD 03/08/21 2358

## 2021-03-09 LAB — CBC
HCT: 39.1 % (ref 36.0–46.0)
Hemoglobin: 12.8 g/dL (ref 12.0–15.0)
MCH: 30 pg (ref 26.0–34.0)
MCHC: 32.7 g/dL (ref 30.0–36.0)
MCV: 91.6 fL (ref 80.0–100.0)
Platelets: 245 10*3/uL (ref 150–400)
RBC: 4.27 MIL/uL (ref 3.87–5.11)
RDW: 12.1 % (ref 11.5–15.5)
WBC: 19.4 10*3/uL — ABNORMAL HIGH (ref 4.0–10.5)
nRBC: 0 % (ref 0.0–0.2)

## 2021-03-09 LAB — HEMOGLOBIN AND HEMATOCRIT, BLOOD
HCT: 38.2 % (ref 36.0–46.0)
Hemoglobin: 12.6 g/dL (ref 12.0–15.0)

## 2021-03-09 LAB — BASIC METABOLIC PANEL
Anion gap: 10 (ref 5–15)
BUN: 30 mg/dL — ABNORMAL HIGH (ref 8–23)
CO2: 29 mmol/L (ref 22–32)
Calcium: 9.4 mg/dL (ref 8.9–10.3)
Chloride: 98 mmol/L (ref 98–111)
Creatinine, Ser: 1.07 mg/dL — ABNORMAL HIGH (ref 0.44–1.00)
GFR, Estimated: 54 mL/min — ABNORMAL LOW (ref >60)
Glucose, Bld: 140 mg/dL — ABNORMAL HIGH (ref 70–99)
Potassium: 4 mmol/L (ref 3.5–5.1)
Sodium: 137 mmol/L (ref 135–145)

## 2021-03-09 LAB — GLUCOSE, CAPILLARY: Glucose-Capillary: 151 mg/dL — ABNORMAL HIGH (ref 70–99)

## 2021-03-09 LAB — HEMOGLOBIN A1C
Hgb A1c MFr Bld: 6.9 % — ABNORMAL HIGH (ref 4.8–5.6)
Mean Plasma Glucose: 151.33 mg/dL

## 2021-03-09 LAB — SARS CORONAVIRUS 2 (TAT 6-24 HRS): SARS Coronavirus 2: NEGATIVE

## 2021-03-09 MED ORDER — ATENOLOL-CHLORTHALIDONE 100-25 MG PO TABS: 1.0000 | ORAL_TABLET | Freq: Every day | ORAL | Status: DC

## 2021-03-09 MED ORDER — CHLORTHALIDONE 25 MG PO TABS
25.0000 mg | ORAL_TABLET | Freq: Every day | ORAL | Status: DC
  Administered 2021-03-09 – 2021-03-10 (×2): 25 mg via ORAL
  Filled 2021-03-09 (×2): qty 1

## 2021-03-09 MED ORDER — SODIUM CHLORIDE 0.9 % IV SOLN: INTRAVENOUS | Status: DC

## 2021-03-09 MED ORDER — LACTATED RINGERS IV SOLN: INTRAVENOUS | Status: DC

## 2021-03-09 MED ORDER — LOSARTAN POTASSIUM 50 MG PO TABS
100.0000 mg | ORAL_TABLET | Freq: Every day | ORAL | Status: DC
  Administered 2021-03-09 – 2021-03-10 (×2): 100 mg via ORAL
  Filled 2021-03-09 (×2): qty 2

## 2021-03-09 MED ORDER — ATENOLOL 50 MG PO TABS
100.0000 mg | ORAL_TABLET | Freq: Every day | ORAL | Status: DC
  Administered 2021-03-09 – 2021-03-10 (×2): 100 mg via ORAL
  Filled 2021-03-09 (×2): qty 2

## 2021-03-09 MED ORDER — ROSUVASTATIN CALCIUM 10 MG PO TABS
10.0000 mg | ORAL_TABLET | Freq: Every day | ORAL | Status: DC
  Filled 2021-03-09: qty 1

## 2021-03-09 MED ORDER — PIPERACILLIN-TAZOBACTAM 3.375 G IVPB
3.3750 g | Freq: Three times a day (TID) | INTRAVENOUS | Status: DC
  Administered 2021-03-09 – 2021-03-10 (×4): 3.375 g via INTRAVENOUS
  Filled 2021-03-09 (×5): qty 50

## 2021-03-09 MED ORDER — ENOXAPARIN SODIUM 40 MG/0.4ML ~~LOC~~ SOLN
40.0000 mg | SUBCUTANEOUS | Status: DC
  Administered 2021-03-09: 40 mg via SUBCUTANEOUS
  Filled 2021-03-09 (×2): qty 0.4

## 2021-03-09 NOTE — Progress Notes (Signed)
Pt had small bowel movement w/ large amount liquid blood. Pt concerned d/t blood amount and requested to notify MD. Paged Dr. Tonie Griffith.

## 2021-03-09 NOTE — ED Notes (Signed)
ED TO INPATIENT HANDOFF REPORT  Name/Age/Gender Debra Galvan 75 y.o. female  Code Status    Code Status Orders  (From admission, onward)         Start     Ordered   03/08/21 2349  Full code  Continuous        03/08/21 2348        Code Status History    This patient has a current code status but no historical code status.   Advance Care Planning Activity      Home/SNF/Other Home  Chief Complaint Colitis [K52.9]  Level of Care/Admitting Diagnosis ED Disposition    ED Disposition Condition Comment   Admit  Hospital Area: [facility] [[id number]]  Level of Care: Med-Surg [16]  May admit patient to [facility] or [facility] if equivalent level of care is available:: Yes  Covid Evaluation: Asymptomatic Screening Protocol (No Symptoms)  Diagnosis: Colitis [354656]  Admitting Physician: Eben Burow [[id number]]  Attending Physician: Eben Burow [[id number]]  Estimated length of stay: past midnight tomorrow  Certification:: I certify this patient will need inpatient services for at least 2 midnights       Medical History Past Medical History:  Diagnosis Date  . Allergy   . Anemia   . Arthritis    shoulder  . Asthma   . Cancer ([place])    skin cancer  . Cataract    small forming   . Diabetes mellitus ([place])   . Diverticulosis   . GERD (gastroesophageal reflux disease)   . Heart murmur   . Hyperlipidemia   . Hypertension   . Osteoporosis     Allergies Allergies  Allergen Reactions  . Prednisone Other (See Comments)    REACTION: Hyperactivity    IV Location/Drains/Wounds Patient Lines/Drains/Airways Status    Active Line/Drains/Airways    Name Placement date Placement time Site Days   Peripheral IV 03/08/21 Right Antecubital 03/08/21  --  Antecubital  1          Labs/Imaging Results for orders placed or performed during the hospital encounter of 03/08/21 (from the past 48 hour(s))  Lipase, blood     Status: None    Collection Time: 03/08/21  7:44 PM  Result Value Ref Range   Lipase 27 11 - 51 U/L    Comment: Performed at [facility], [place] [address], [place], [place] [address]  Comprehensive metabolic panel     Status: Abnormal   Collection Time: 03/08/21  7:44 PM  Result Value Ref Range   Sodium 135 135 - 145 mmol/L   Potassium 3.4 (L) 3.5 - 5.1 mmol/L   Chloride 97 (L) 98 - 111 mmol/L   CO2 27 22 - 32 mmol/L   Glucose, Bld 146 (H) 70 - 99 mg/dL    Comment: Glucose reference range applies only to samples taken after fasting for at least 8 hours.   BUN 35 (H) 8 - 23 mg/dL   Creatinine, Ser 1.18 (H) 0.44 - 1.00 mg/dL   Calcium 9.8 8.9 - 10.3 mg/dL   Total Protein 8.5 (H) 6.5 - 8.1 g/dL   Albumin 4.4 3.5 - 5.0 g/dL   AST 22 15 - 41 U/L   ALT 19 0 - 44 U/L   Alkaline Phosphatase 63 38 - 126 U/L   Total Bilirubin 1.0 0.3 - 1.2 mg/dL   GFR, Estimated 48 (L) >60 mL/min    Comment: (NOTE) Calculated using the CKD-EPI Creatinine Equation (2021)  Anion gap 11 5 - 15    Comment: Performed at [facility], [place] [address], [place], [place] [address]  CBC     Status: Abnormal   Collection Time: 03/08/21  7:44 PM  Result Value Ref Range   WBC 23.4 (H) 4.0 - 10.5 K/uL   RBC 4.67 3.87 - 5.11 MIL/uL   Hemoglobin 14.3 12.0 - 15.0 g/dL   HCT 43.2 36.0 - 46.0 %   MCV 92.5 80.0 - 100.0 fL   MCH 30.6 26.0 - 34.0 pg   MCHC 33.1 30.0 - 36.0 g/dL   RDW 12.2 11.5 - 15.5 %   Platelets 305 150 - 400 K/uL   nRBC 0.0 0.0 - 0.2 %    Comment: Performed at [facility], [place] [address] [place], [place] [address]  Urinalysis, Routine w reflex microscopic     Status: Abnormal   Collection Time: 03/08/21  7:44 PM  Result Value Ref Range   Color, Urine YELLOW YELLOW   APPearance CLEAR CLEAR   Specific Gravity, Urine 1.024 1.005 - 1.030   pH 5.0 5.0 - 8.0   Glucose, UA NEGATIVE NEGATIVE mg/dL   Hgb urine dipstick SMALL (A) NEGATIVE   Bilirubin Urine  NEGATIVE NEGATIVE   Ketones, ur 5 (A) NEGATIVE mg/dL   Protein, ur NEGATIVE NEGATIVE mg/dL   Nitrite NEGATIVE NEGATIVE   Leukocytes,Ua NEGATIVE NEGATIVE   RBC / HPF 0-5 0 - 5 RBC/hpf   WBC, UA 0-5 0 - 5 WBC/hpf   Bacteria, UA NONE SEEN NONE SEEN   Squamous Epithelial / LPF 0-5 0 - 5   Mucus PRESENT    Hyaline Casts, UA PRESENT     Comment: Performed at [facility], [place] [address] [place], [place] [address]  POC occult blood, ED     Status: Abnormal   Collection Time: 03/08/21  9:41 PM  Result Value Ref Range   Fecal Occult Bld POSITIVE (A) NEGATIVE  CBC with Differential     Status: Abnormal   Collection Time: 03/08/21 11:05 PM  Result Value Ref Range   WBC 20.5 (H) 4.0 - 10.5 K/uL   RBC 4.22 3.87 - 5.11 MIL/uL   Hemoglobin 12.9 12.0 - 15.0 g/dL   HCT 38.8 36.0 - 46.0 %   MCV 91.9 80.0 - 100.0 fL   MCH 30.6 26.0 - 34.0 pg   MCHC 33.2 30.0 - 36.0 g/dL   RDW 12.2 11.5 - 15.5 %   Platelets 260 150 - 400 K/uL   nRBC 0.0 0.0 - 0.2 %   Neutrophils Relative % 88 %   Neutro Abs 18.0 (H) 1.7 - 7.7 K/uL   Lymphocytes Relative 7 %   Lymphs Abs 1.5 0.7 - 4.0 K/uL   Monocytes Relative 4 %   Monocytes Absolute 0.9 0.1 - 1.0 K/uL   Eosinophils Relative 0 %   Eosinophils Absolute 0.0 0.0 - 0.5 K/uL   Basophils Relative 0 %   Basophils Absolute 0.1 0.0 - 0.1 K/uL   Immature Granulocytes 1 %   Abs Immature Granulocytes 0.10 (H) 0.00 - 0.07 K/uL    Comment: Performed at [facility], [place] [address], [place], [place] [address]   CT Abdomen Pelvis W Contrast  Result Date: 03/08/2021 CLINICAL DATA:  Lower abdominal pain, dysuria, left lower quadrant abdominal pain, diarrhea EXAM: CT ABDOMEN AND PELVIS WITH CONTRAST TECHNIQUE: Multidetector CT imaging of the abdomen and pelvis was performed using the standard protocol following  bolus administration of intravenous contrast. CONTRAST:  30mL OMNIPAQUE IOHEXOL 300 MG/ML  SOLN COMPARISON:  None. FINDINGS:  Lower chest: The visualized lung bases are clear bilaterally. The visualized heart and pericardium are unremarkable. Hepatobiliary: No focal liver abnormality is seen. No gallstones, gallbladder wall thickening, or biliary dilatation. Pancreas: Unremarkable Spleen: Unremarkable Adrenals/Urinary Tract: Adrenal glands are unremarkable. Kidneys are normal, without renal calculi, focal lesion, or hydronephrosis. Bladder is unremarkable. Stomach/Bowel: The stomach and small bowel are unremarkable. The appendix is absent. There is a 7 mm hyperdense nodule within the a left pericolic gutter inferiorly adjacent to the cecum with a small amount of surrounding inflammatory change. This may represent postsurgical change related to prior appendectomy. Alternatively, a residual appendicular lith with surrounding inflammatory change could result in such an appearance. There is circumferential wall thickening and mild pericolonic inflammatory stranding involving the descending and sigmoid colon in keeping with infectious, inflammatory, or, given the distribution, ischemic colitis. Mild superimposed sigmoid diverticulosis. No evidence of obstruction or perforation. No free intraperitoneal gas or fluid. The central mesenteric arterial and venous vasculature appears patent. Vascular/Lymphatic: Moderate aortic atherosclerotic calcification. As noted above, the mesenteric vasculature appears unremarkable on this non arteriographic study. Retroaortic left renal vein. No pathologic adenopathy within the abdomen and pelvis. Reproductive: Status post hysterectomy. No adnexal masses. Other: Tiny fat containing umbilical hernia. The rectum is unremarkable. Musculoskeletal: Degenerative changes are seen within the lumbar spine. No lytic or blastic bone lesion is identified. IMPRESSION: Long segment inflammatory changes involving the descending and sigmoid colon in keeping with an infectious or inflammatory colitis. Given the distribution,  ischemic colitis should also be considered though the central mesenteric vasculature appears patent on this examination. No evidence of obstruction or perforation. 7 mm hyperdense nodule within the right pericolic gutter adjacent to the cecum possibly the sequela of prior appendectomy. A retained appendicolith with surrounding inflammatory change could result in such an appearance. Aortic Atherosclerosis (ICD10-I70.0). Electronically Signed   By: Fidela Salisbury MD   On: 03/08/2021 22:45    Pending Labs Unresulted Labs (From admission, onward)          Start     Ordered   03/15/21 0500  Creatinine, serum  (enoxaparin (LOVENOX)    CrCl >/= 30 ml/min)  Weekly,   R     Comments: while on enoxaparin therapy    03/08/21 2348   03/09/21 1610  Basic metabolic panel  Tomorrow morning,   R        03/08/21 2348   03/09/21 0500  CBC  Tomorrow morning,   R        03/08/21 2348   03/08/21 2349  Hemoglobin A1c  Once,   STAT        03/08/21 2348   03/08/21 2325  C Difficile Quick Screen w PCR reflex  Once,   R       References:    CDiff Information Tool   03/08/21 2325   03/08/21 2302  Gastrointestinal Panel by PCR , Stool  (Gastrointestinal Panel by PCR, Stool                                                                                                                                                     **  Does Not include CLOSTRIDIUM DIFFICILE testing. **If CDIFF testing is needed, place order from the "C Difficile Testing" order set.**)  Once,   STAT        03/08/21 2301   03/08/21 2302  SARS CORONAVIRUS 2 (TAT 6-24 HRS) Nasopharyngeal Nasopharyngeal Swab  (Tier 3 - Symptomatic/asymptomatic with Precautions)  Once,   STAT       Question Answer Comment  Is this test for diagnosis or screening Screening   Symptomatic for COVID-19 as defined by CDC No   Hospitalized for COVID-19 No   Admitted to ICU for COVID-19 No   Previously tested for COVID-19 No   Resident in a congregate (group) care setting No    Employed in healthcare setting No   Pregnant No   Has patient completed COVID vaccination(s) (2 doses of Pfizer/Moderna 1 dose of [employer]) Yes   Has patient completed COVID Booster / 3rd dose Yes      03/08/21 2302          Vitals/Pain Today's Vitals   03/08/21 2200 03/08/21 2300 03/08/21 2321 03/08/21 2330  BP: (!) 157/74 (!) 174/74 (!) 174/74 (!) 166/67  Pulse: (!) 56 (!) 56 92 (!) 54  Resp: 18  18 17   Temp:      TempSrc:      SpO2: 98% 98% 100% 97%  PainSc:        Isolation Precautions Airborne and Contact precautions  Medications Medications  albuterol (VENTOLIN HFA) 108 (90 Base) MCG/ACT inhaler 2 puff (has no administration in time range)  acetaminophen (TYLENOL) tablet 650 mg (has no administration in time range)    Or  acetaminophen (TYLENOL) suppository 650 mg (has no administration in time range)  ondansetron (ZOFRAN) tablet 4 mg (has no administration in time range)    Or  ondansetron (ZOFRAN) injection 4 mg (has no administration in time range)  iohexol (OMNIPAQUE) 300 MG/ML solution 80 mL (80 mLs Intravenous Contrast Given 03/08/21 2222)  piperacillin-tazobactam (ZOSYN) IVPB 3.375 g (0 g Intravenous Stopped 03/08/21 2350)    Mobility walks

## 2021-03-09 NOTE — Progress Notes (Signed)
PROGRESS NOTE    Debra Galvan  ONG:[id number] DOB: 11-18-1946 DOA: 03/08/2021 PCP: Michael Boston, MD   Brief Narrative:hpi per dr Tonie Griffith 03/08/21  Debra Galvan is a 75 y.o. female with medical history significant for HTN, DMT2 diet controlled, mild intermittent asthma, HLD who presents for evaluation of sudden onset of diarrhea and abdominal pain this afternoon. She reports that around 3 pm this afternoon she developed a sudden onset of lower abdominal pain that she reports was a sharp crampy feeling that did not radiate.  Few minutes later she had profuse diarrhea which is associated with chills and some diaphoresis.  She denies bright red blood in the bowel movement.  She reports having multiple bouts of watery diarrhea after that.  She was at a store with her husband when the symptoms began and she spent 30 minutes in the bathroom.  After going home she laid down for a little bit but continued to have abdominal pain so she decided to come for evaluation.  Is not had any fever.  Denies any vomiting although she did have some nausea with the diarrhea.  She denies any recent travel or change in her diet.  She denies consuming any seafood or raw or undercooked meats.  She has not been on antibiotics in the last 3 months.  She has had 3 colonoscopies in the past with the last one being a year and a half ago.  She reports has been told she has diverticulosis but no other pathology.  Lives with her husband.  Denies tobacco, alcohol, illicit drug use Assessment & Plan:   Principal Problem:   Colitis Active Problems:   Leukocytosis   AKI (acute kidney injury) ([place])   Essential hypertension   Diabetes mellitus type 2 in obese ([place])    #1 Colitis- she is admitted with abdominal pain and diarrhea.  Work-up showed CT abdomen-long segment inflammatory changes involving the descending and sigmoid colon, could be inflammatory or infectious, given the distribution ischemic colitis should be  considered though the central mesenteric vasculature structure appears patent.  No evidence of obstruction or perforation. Her white count is 19.4 from 23.4 Continue Zosyn IV fluids slow advancement of diet as tolerated Stool C. difficile and stool panel has been ordered but she has not had any diarrhea since coming up to the floor.  #2 AKI improved  with IV fluids.cr 1.07 form 1.18  #3 history of essential hypertension on losartan and Tenoretic.  #4 type 2 diabetes diet controlled.  Hemoglobin A1c 6.9    Estimated body mass index is 30.28 kg/m as calculated from the following:   Height as of 10/21/20: 5' 4.5" (1.638 m).   Weight as of 10/21/20: 81.3 kg.  DVT prophylaxis: Lovenox  CODE STATUS full code  family Communication: Discussed with husband at the bedside Disposition Plan:  Status is: Inpatient.  Consider discharge 24 to 48 hours if improved.  Discussed with patient.  Dispo: The patient is from: Home              Anticipated d/c is to: Home              Patient currently is not medically stable to d/c.   Difficult to place patient na Consultants:   None  Procedures: None  antimicrobials: Zosyn  Subjective: Patient is resting in bed complains of 7 out of 10 abdominal pain mostly in the lower abdomen denies urinary complaints no nausea vomiting diarrhea better  Objective: Vitals:  03/08/21 2330 03/09/21 0000 03/09/21 0052 03/09/21 0537  BP: (!) 166/67 (!) 166/81 (!) 149/72 134/65  Pulse: (!) 54 (!) 58 (!) 56 65  Resp: 17 18 16 16   Temp:  97.9 F (36.6 C) 98.3 F (36.8 C) 98.3 F (36.8 C)  TempSrc:  Oral Oral Oral  SpO2: 97% 97% 99% 98%    Intake/Output Summary (Last 24 hours) at 03/09/2021 1150 Last data filed at 03/09/2021 0641 Gross per 24 hour  Intake 655.21 ml  Output --  Net 655.21 ml   There were no vitals filed for this visit.  Examination:  General exam: Appears calm and comfortable  Respiratory system: Clear to auscultation. Respiratory  effort normal. Cardiovascular system: S1 & S2 heard, RRR. No JVD, murmurs, rubs, gallops or clicks. No pedal edema. Gastrointestinal system: Abdomen is nondistended, soft and lower abdominal tender. No organomegaly or masses felt. Normal bowel sounds heard. Central nervous system: Alert and oriented. No focal neurological deficits. Extremities: Symmetric 5 x 5 power. Skin: No rashes, lesions or ulcers Psychiatry: Judgement and insight appear normal. Mood & affect appropriate.     Data Reviewed: I have personally reviewed following labs and imaging studies  CBC: Recent Labs  Lab 03/08/21 1944 03/08/21 2305 03/09/21 0324  WBC 23.4* 20.5* 19.4*  NEUTROABS  --  18.0*  --   HGB 14.3 12.9 12.8  HCT 43.2 38.8 39.1  MCV 92.5 91.9 91.6  PLT 305 260 008   Basic Metabolic Panel: Recent Labs  Lab 03/08/21 1944 03/09/21 0324  NA 135 137  K 3.4* 4.0  CL 97* 98  CO2 27 29  GLUCOSE 146* 140*  BUN 35* 30*  CREATININE 1.18* 1.07*  CALCIUM 9.8 9.4   GFR: CrCl cannot be calculated (Unknown ideal weight.). Liver Function Tests: Recent Labs  Lab 03/08/21 1944  AST 22  ALT 19  ALKPHOS 63  BILITOT 1.0  PROT 8.5*  ALBUMIN 4.4   Recent Labs  Lab 03/08/21 1944  LIPASE 27   No results for input(s): AMMONIA in the last 168 hours. Coagulation Profile: No results for input(s): INR, PROTIME in the last 168 hours. Cardiac Enzymes: No results for input(s): CKTOTAL, CKMB, CKMBINDEX, TROPONINI in the last 168 hours. BNP (last 3 results) No results for input(s): PROBNP in the last 8760 hours. HbA1C: Recent Labs    03/08/21 2305  HGBA1C 6.9*   CBG: No results for input(s): GLUCAP in the last 168 hours. Lipid Profile: No results for input(s): CHOL, HDL, LDLCALC, TRIG, CHOLHDL, LDLDIRECT in the last 72 hours. Thyroid Function Tests: No results for input(s): TSH, T4TOTAL, FREET4, T3FREE, THYROIDAB in the last 72 hours. Anemia Panel: No results for input(s): VITAMINB12, FOLATE,  FERRITIN, TIBC, IRON, RETICCTPCT in the last 72 hours. Sepsis Labs: No results for input(s): PROCALCITON, LATICACIDVEN in the last 168 hours.  Recent Results (from the past 240 hour(s))  SARS CORONAVIRUS 2 (TAT 6-24 HRS) Nasopharyngeal Nasopharyngeal Swab     Status: None   Collection Time: 03/08/21 11:02 PM   Specimen: Nasopharyngeal Swab  Result Value Ref Range Status   SARS Coronavirus 2 NEGATIVE NEGATIVE Final    Comment: (NOTE) SARS-CoV-2 target nucleic acids are NOT DETECTED.  The SARS-CoV-2 RNA is generally detectable in upper and lower respiratory specimens during the acute phase of infection. Negative results do not preclude SARS-CoV-2 infection, do not rule out co-infections with other pathogens, and should not be used as the sole basis for treatment or other patient management decisions. Negative results must  be combined with clinical observations, patient history, and epidemiological information. The expected result is Negative.  Fact Sheet for Patients: SugarRoll.be  Fact Sheet for Healthcare Providers: https://www.[employer].com/  This test is not yet approved or cleared by the [place] FDA and  has been authorized for detection and/or diagnosis of SARS-CoV-2 by FDA under an Emergency Use Authorization (EUA). This EUA will remain  in effect (meaning this test can be used) for the duration of the COVID-19 declaration under Se ction 564(b)(1) of the Act, 21 U.S.C. section 360bbb-3(b)(1), unless the authorization is terminated or revoked sooner.  Performed at [facility], [place] [address] [place], [place] [address]          Radiology Studies: CT Abdomen Pelvis W Contrast  Result Date: 03/08/2021 CLINICAL DATA:  Lower abdominal pain, dysuria, left lower quadrant abdominal pain, diarrhea EXAM: CT ABDOMEN AND PELVIS WITH CONTRAST TECHNIQUE: Multidetector CT imaging of the abdomen and pelvis was performed  using the standard protocol following bolus administration of intravenous contrast. CONTRAST:  63mL OMNIPAQUE IOHEXOL 300 MG/ML  SOLN COMPARISON:  None. FINDINGS: Lower chest: The visualized lung bases are clear bilaterally. The visualized heart and pericardium are unremarkable. Hepatobiliary: No focal liver abnormality is seen. No gallstones, gallbladder wall thickening, or biliary dilatation. Pancreas: Unremarkable Spleen: Unremarkable Adrenals/Urinary Tract: Adrenal glands are unremarkable. Kidneys are normal, without renal calculi, focal lesion, or hydronephrosis. Bladder is unremarkable. Stomach/Bowel: The stomach and small bowel are unremarkable. The appendix is absent. There is a 7 mm hyperdense nodule within the a left pericolic gutter inferiorly adjacent to the cecum with a small amount of surrounding inflammatory change. This may represent postsurgical change related to prior appendectomy. Alternatively, a residual appendicular lith with surrounding inflammatory change could result in such an appearance. There is circumferential wall thickening and mild pericolonic inflammatory stranding involving the descending and sigmoid colon in keeping with infectious, inflammatory, or, given the distribution, ischemic colitis. Mild superimposed sigmoid diverticulosis. No evidence of obstruction or perforation. No free intraperitoneal gas or fluid. The central mesenteric arterial and venous vasculature appears patent. Vascular/Lymphatic: Moderate aortic atherosclerotic calcification. As noted above, the mesenteric vasculature appears unremarkable on this non arteriographic study. Retroaortic left renal vein. No pathologic adenopathy within the abdomen and pelvis. Reproductive: Status post hysterectomy. No adnexal masses. Other: Tiny fat containing umbilical hernia. The rectum is unremarkable. Musculoskeletal: Degenerative changes are seen within the lumbar spine. No lytic or blastic bone lesion is identified.  IMPRESSION: Long segment inflammatory changes involving the descending and sigmoid colon in keeping with an infectious or inflammatory colitis. Given the distribution, ischemic colitis should also be considered though the central mesenteric vasculature appears patent on this examination. No evidence of obstruction or perforation. 7 mm hyperdense nodule within the right pericolic gutter adjacent to the cecum possibly the sequela of prior appendectomy. A retained appendicolith with surrounding inflammatory change could result in such an appearance. Aortic Atherosclerosis (ICD10-I70.0). Electronically Signed   By: Fidela Salisbury MD   On: 03/08/2021 22:45        Scheduled Meds: . atenolol  100 mg Oral Daily   And  . chlorthalidone  25 mg Oral Daily  . enoxaparin (LOVENOX) injection  40 mg Subcutaneous Q24H  . losartan  100 mg Oral Daily  . rosuvastatin  10 mg Oral Daily   Continuous Infusions: . lactated ringers 125 mL/hr at 03/09/21 0106  . piperacillin-tazobactam (ZOSYN)  IV 3.375 g (03/09/21 0538)     LOS: 1 day  Georgette Shell, MD  03/09/2021, 11:50 AM

## 2021-03-10 LAB — COMPREHENSIVE METABOLIC PANEL
ALT: 14 U/L (ref 0–44)
AST: 16 U/L (ref 15–41)
Albumin: 3.3 g/dL — ABNORMAL LOW (ref 3.5–5.0)
Alkaline Phosphatase: 42 U/L (ref 38–126)
Anion gap: 9 (ref 5–15)
BUN: 19 mg/dL (ref 8–23)
CO2: 28 mmol/L (ref 22–32)
Calcium: 8.8 mg/dL — ABNORMAL LOW (ref 8.9–10.3)
Chloride: 103 mmol/L (ref 98–111)
Creatinine, Ser: 1.01 mg/dL — ABNORMAL HIGH (ref 0.44–1.00)
GFR, Estimated: 58 mL/min — ABNORMAL LOW (ref >60)
Glucose, Bld: 121 mg/dL — ABNORMAL HIGH (ref 70–99)
Potassium: 3.3 mmol/L — ABNORMAL LOW (ref 3.5–5.1)
Sodium: 140 mmol/L (ref 135–145)
Total Bilirubin: 1 mg/dL (ref 0.3–1.2)
Total Protein: 6.5 g/dL (ref 6.5–8.1)

## 2021-03-10 LAB — CBC
HCT: 37.8 % (ref 36.0–46.0)
Hemoglobin: 12.4 g/dL (ref 12.0–15.0)
MCH: 30.3 pg (ref 26.0–34.0)
MCHC: 32.8 g/dL (ref 30.0–36.0)
MCV: 92.4 fL (ref 80.0–100.0)
Platelets: 225 10*3/uL (ref 150–400)
RBC: 4.09 MIL/uL (ref 3.87–5.11)
RDW: 12.2 % (ref 11.5–15.5)
WBC: 12.5 10*3/uL — ABNORMAL HIGH (ref 4.0–10.5)
nRBC: 0 % (ref 0.0–0.2)

## 2021-03-10 MED ORDER — ALBUTEROL SULFATE HFA 108 (90 BASE) MCG/ACT IN AERS: 1.0000 | INHALATION_SPRAY | Freq: Four times a day (QID) | RESPIRATORY_TRACT | Status: AC | PRN

## 2021-03-10 MED ORDER — POTASSIUM CHLORIDE CRYS ER 20 MEQ PO TBCR
30.0000 meq | EXTENDED_RELEASE_TABLET | ORAL | Status: AC
  Administered 2021-03-10 (×2): 30 meq via ORAL
  Filled 2021-03-10 (×2): qty 1

## 2021-03-10 MED ORDER — AMOXICILLIN-POT CLAVULANATE 875-125 MG PO TABS: 1.0000 | ORAL_TABLET | Freq: Two times a day (BID) | ORAL | 0 refills | Status: AC

## 2021-03-10 NOTE — Plan of Care (Signed)
  Problem: Education: Goal: Knowledge of General Education information will improve Description: Including pain rating scale, medication(s)/side effects and non-pharmacologic comfort measures Outcome: Adequate for Discharge   Problem: Health Behavior/Discharge Planning: Goal: Ability to manage health-related needs will improve Outcome: Adequate for Discharge   Problem: Clinical Measurements: Goal: Ability to maintain clinical measurements within normal limits will improve Outcome: Adequate for Discharge Goal: Will remain free from infection Outcome: Adequate for Discharge Goal: Diagnostic test results will improve Outcome: Adequate for Discharge Goal: Cardiovascular complication will be avoided Outcome: Adequate for Discharge   Problem: Nutrition: Goal: Adequate nutrition will be maintained Outcome: Adequate for Discharge   Problem: Activity: Goal: Risk for activity intolerance will decrease Outcome: Adequate for Discharge   Problem: Coping: Goal: Level of anxiety will decrease Outcome: Adequate for Discharge

## 2021-03-10 NOTE — Discharge Instructions (Signed)
Colitis  Colitis is a condition in which the colon is inflamed. It can cause diarrhea, blood in the stool, and abdominal pain. Colitis can last a short time (be acute), or it may last a long time (become chronic). What are the causes? This condition may be caused by:  Infections from viruses or bacteria.  A reaction to medicine.  Certain autoimmune diseases, such as Crohn's disease or ulcerative colitis.  Radiation treatment.  Decreased blood flow to the bowel (ischemia). What are the signs or symptoms? Symptoms of this condition include:  Diarrhea, blood in the stool, or black, tarry stool.  Pain in the joints or abdominal pain.  Fever or fatigue.  Vomiting.  Weight loss.  Bloating.  Having fewer bowel movements than usual.  A strong and sudden urge to have a bowel movement.  Feeling like the bowel is not empty after a bowel movement. How is this diagnosed? This condition may be diagnosed based on a stool test and a blood test. You may also have other tests, such as:  X-rays.  CT scan.  Colonoscopy.  Endoscopy.  Biopsy. How is this treated? Treatment for this condition depends on the cause. This condition may be treated with:  Steps to rest the bowel, such as not eating or drinking for a period of time.  Fluids that are given through an IV.  Medicine for pain and diarrhea.  Antibiotic medicines.  Cortisone medicines.  Surgery. Follow these instructions at home: Eating and drinking  Follow instructions from your health care provider about eating or drinking restrictions.  Drink enough fluid to keep your urine pale yellow.  Work with a dietitian to determine whether certain foods cause your condition to flare up.  Avoid foods or drinks that cause flare-ups.  Eat a well-balanced diet.   General instructions  If you were prescribed an antibiotic medicine, take it as told by your health care provider. Do not stop taking the antibiotic even if you  start to feel better.  Take over-the-counter and prescription medicines only as told by your health care provider.  Keep all follow-up visits. This is important. Contact a health care provider if:  Your symptoms do not go away.  You develop new symptoms. Get help right away if:  You have a fever that does not go away with treatment.  You develop chills.  You have extreme weakness, fainting, or dehydration.  You vomit repeatedly.  You develop severe pain in your abdomen.  You pass bloody or tarry stool. Summary  Colitis is a condition in which the colon is inflamed. Colitis can last a short time (be acute), or it may last a long time (become chronic).  Treatment for this condition depends on the cause and may include resting the bowel, taking medicines, or having surgery.  If you were prescribed an antibiotic medicine, take it as told by your health care provider. Do not stop taking the antibiotic even if you start to feel better.  Get help right away if you develop severe pain in your abdomen.  Keep all follow-up visits. This is important. This information is not intended to replace advice given to you by your health care provider. Make sure you discuss any questions you have with your health care provider. Document Revised: 08/04/2020 Document Reviewed: 08/04/2020 Elsevier Patient Education  2021 [place] Diet A bland diet consists of foods that are often soft and do not have a lot of fat, fiber, or extra seasonings. Foods without fat,  fiber, or seasoning are easier for the body to digest. They are also less likely to irritate your mouth, throat, stomach, and other parts of your digestive system. A bland diet is sometimes called a BRAT diet. What is my plan? Your health care provider or food and nutrition specialist (dietitian) may recommend specific changes to your diet to prevent symptoms or to treat your symptoms. These changes may include:  Eating small  meals often.  Cooking food until it is soft enough to chew easily.  Chewing your food well.  Drinking fluids slowly.  Not eating foods that are very spicy, sour, or fatty.  Not eating citrus fruits, such as oranges and grapefruit. What do I need to know about this diet?  Eat a variety of foods from the bland diet food list.  Do not follow a bland diet longer than needed.  Ask your health care provider whether you should take vitamins or supplements. What foods can I eat? Grains Hot cereals, such as cream of wheat. Rice. Bread, crackers, or tortillas made from refined white flour.   Vegetables Canned or cooked vegetables. Mashed or boiled potatoes. Fruits Bananas. Applesauce. Other types of cooked or canned fruit with the skin and seeds removed, such as canned peaches or pears.   Meats and other proteins Scrambled eggs. Creamy peanut butter or other nut butters. Lean, well-cooked meats, such as chicken or fish. Tofu. Soups or broths.   Dairy Low-fat dairy products, such as milk, cottage cheese, or yogurt. Beverages Water. Herbal tea. Apple juice.   Fats and oils Mild salad dressings. Canola or olive oil. Sweets and desserts Pudding. Custard. Fruit gelatin. Ice cream. The items listed above may not be a complete list of recommended foods and beverages. Contact a dietitian for more options. What foods are not recommended? Grains Whole grain breads and cereals. Vegetables Raw vegetables. Fruits Raw fruits, especially citrus, berries, or dried fruits. Dairy Whole fat dairy foods. Beverages Caffeinated drinks. Alcohol. Seasonings and condiments Strongly flavored seasonings or condiments. Hot sauce. Salsa. Other foods Spicy foods. Fried foods. Sour foods, such as pickled or fermented foods. Foods with high sugar content. Foods high in fiber. The items listed above may not be a complete list of foods and beverages to avoid. Contact a dietitian for more  information. Summary  A bland diet consists of foods that are often soft and do not have a lot of fat, fiber, or extra seasonings.  Foods without fat, fiber, or seasoning are easier for the body to digest.  Check with your health care provider to see how long you should follow this diet plan. It is not meant to be followed for long periods. This information is not intended to replace advice given to you by your health care provider. Make sure you discuss any questions you have with your health care provider. Document Revised: 12/27/2017 Document Reviewed: 12/27/2017 Elsevier Patient Education  2021 [employer].

## 2021-03-10 NOTE — Discharge Summary (Signed)
Physician Discharge Summary  Debra Galvan TZG:[id number] DOB: 04-23-1946 DOA: 03/08/2021  PCP: Michael Boston, MD  Admit date: 03/08/2021 Discharge date: 03/10/2021  Admitted From: Home Disposition: Home  Recommendations for Outpatient Follow-up:  1. Follow up with PCP in 1-2 weeks 2. Follow-up with gastroenterology, Dr. Havery Moros 4 weeks 3. Continue antibiotics with Augmentin for colitis 4. Please obtain BMP/CBC in one week  Home Health: No Equipment/Devices: None  Discharge Condition: Stable CODE STATUS: Full code Diet recommendation: Bland/soft diet  History of present illness:  Debra N Sillsis a 75 y.o.femalewith medical history significant forHTN, DMT2 diet controlled, mild intermittent asthma, HLD who presents for evaluation of sudden onset of diarrhea and abdominal pain this afternoon. She reports that around 3 pm this afternoon shedeveloped a sudden onset of lower abdominal pain that she reports was a sharp crampy feeling that did not radiate. Few minutes later she had profuse diarrhea which is associated with chills and some diaphoresis. She denies bright red blood in the bowel movement. She reports having multiple bouts of watery diarrhea after that. She was at a store with her husband when the symptoms began and she spent 30 minutes in the bathroom. After going home she laid down for a little bit but continued to have abdominal pain so she decided to come for evaluation. Is not had any fever. Denies any vomiting although she did have some nausea with the diarrhea. She denies any recent travel or change in her diet. She denies consuming any seafood or raw or undercooked meats. She has not been on antibiotics in the last 3 months. She has had 3 colonoscopies in the past with the last one being a year and a half ago. She reports has been told she has diverticulosis but no other pathology.  Lives with her husband. Denies tobacco, alcohol, illicit drug use.   Patient admitted for acute colitis.  Hospital course:  Acute colitis Patient presenting to ED with progressive abdominal pain associate with diarrhea.  CT abdomen/pelvis notable for inflammatory changes involving the descending and sigmoid colon.  Patient also with elevated WBC count of 23.4.  Suspect infectious colitis, also considered inflammatory versus ischemic although the central mesenteric vascular structure appears patent.  No signs of obstruction or perforation on CT imaging.  Patient was started on IV fluids and Zosyn with improvement of her symptoms.  WBC count improved from 23.4-12.5 at time of discharge.  Patient has been tolerating her diet without any further nausea/vomiting.  We will continue antibiotics with Augmentin to complete 14-day course.  Outpatient follow-up with PCP and GI.  Repeat CBC in 1 week.  Acute renal failure Creatinine 1.18 at time of admission, likely secondary to dehydration, diarrhea and acute colitis as above.  Patient was given IV fluid hydration and antibiotics as above with improvement of creatinine to 1.01 at time of discharge.  Repeat BMP 1 week.  Essential hypertension Continue losartan 100 mg p.o. daily and atenolol 100 mg p.o. daily, chlorthalidone 25 mg p.o. daily.  Type 2 diabetes mellitus Hemoglobin A1c, 6.9 well-controlled.  Continue diet control at home.  Discharge Diagnoses:  Principal Problem:   Colitis Active Problems:   Leukocytosis   Essential hypertension   Diabetes mellitus type 2 in obese [facility])    Discharge Instructions  Discharge Instructions    Call MD for:  difficulty breathing, headache or visual disturbances   Complete by: As directed    Call MD for:  extreme fatigue   Complete by: As directed  Call MD for:  persistant dizziness or light-headedness   Complete by: As directed    Call MD for:  persistant nausea and vomiting   Complete by: As directed    Call MD for:  severe uncontrolled pain   Complete by: As  directed    Call MD for:  temperature >100.4   Complete by: As directed    Diet - low sodium heart healthy   Complete by: As directed    Increase activity slowly   Complete by: As directed      Allergies as of 03/10/2021      Reactions   Prednisone Other (See Comments)   REACTION: Hyperactivity      Medication List    TAKE these medications   acetaminophen 500 MG tablet Commonly known as: TYLENOL Take 500 mg by mouth every 6 (six) hours as needed for mild pain, fever or headache.   albuterol 108 (90 Base) MCG/ACT inhaler Commonly known as: VENTOLIN HFA Inhale 1 puff into the lungs every 6 (six) hours as needed for wheezing or shortness of breath. What changed: how much to take   amoxicillin-clavulanate 875-125 MG tablet Commonly known as: Augmentin Take 1 tablet by mouth 2 (two) times daily for 12 days.   atenolol-chlorthalidone 100-25 MG tablet Commonly known as: TENORETIC Take 1 tablet by mouth daily.   calcium-vitamin D 250-125 MG-UNIT tablet Commonly known as: OSCAL WITH D Take 1 tablet by mouth daily.   [employer] Lancets Micro Thin 33G Misc daily. for testing   losartan 100 MG tablet Commonly known as: COZAAR Take 100 mg by mouth daily.   rosuvastatin 10 MG tablet Commonly known as: CRESTOR Take 1 tablet (10 mg total) by mouth daily.   TrueTrack Test test strip Generic drug: glucose blood CHECK DAILY BLOOD SUGAR       Follow-up Information    Michael Boston, MD. Schedule an appointment as soon as possible for a visit in 1 week(s).   Specialty: Internal Medicine Contact information: [address] [place] [address] [phone]        Yetta Flock, MD. Schedule an appointment as soon as possible for a visit in 4 week(s).   Specialty: Gastroenterology Contact information: [employer] [address] [place] [address] [phone]              Allergies  Allergen Reactions  . Prednisone Other (See Comments)    REACTION:  Hyperactivity    Consultations:  None   Procedures/Studies: CT Abdomen Pelvis W Contrast  Result Date: 03/08/2021 CLINICAL DATA:  Lower abdominal pain, dysuria, left lower quadrant abdominal pain, diarrhea EXAM: CT ABDOMEN AND PELVIS WITH CONTRAST TECHNIQUE: Multidetector CT imaging of the abdomen and pelvis was performed using the standard protocol following bolus administration of intravenous contrast. CONTRAST:  68mL OMNIPAQUE IOHEXOL 300 MG/ML  SOLN COMPARISON:  None. FINDINGS: Lower chest: The visualized lung bases are clear bilaterally. The visualized heart and pericardium are unremarkable. Hepatobiliary: No focal liver abnormality is seen. No gallstones, gallbladder wall thickening, or biliary dilatation. Pancreas: Unremarkable Spleen: Unremarkable Adrenals/Urinary Tract: Adrenal glands are unremarkable. Kidneys are normal, without renal calculi, focal lesion, or hydronephrosis. Bladder is unremarkable. Stomach/Bowel: The stomach and small bowel are unremarkable. The appendix is absent. There is a 7 mm hyperdense nodule within the a left pericolic gutter inferiorly adjacent to the cecum with a small amount of surrounding inflammatory change. This may represent postsurgical change related to prior appendectomy. Alternatively, a residual appendicular lith with surrounding  inflammatory change could result in such an appearance. There is circumferential wall thickening and mild pericolonic inflammatory stranding involving the descending and sigmoid colon in keeping with infectious, inflammatory, or, given the distribution, ischemic colitis. Mild superimposed sigmoid diverticulosis. No evidence of obstruction or perforation. No free intraperitoneal gas or fluid. The central mesenteric arterial and venous vasculature appears patent. Vascular/Lymphatic: Moderate aortic atherosclerotic calcification. As noted above, the mesenteric vasculature appears unremarkable on this non arteriographic study.  Retroaortic left renal vein. No pathologic adenopathy within the abdomen and pelvis. Reproductive: Status post hysterectomy. No adnexal masses. Other: Tiny fat containing umbilical hernia. The rectum is unremarkable. Musculoskeletal: Degenerative changes are seen within the lumbar spine. No lytic or blastic bone lesion is identified. IMPRESSION: Long segment inflammatory changes involving the descending and sigmoid colon in keeping with an infectious or inflammatory colitis. Given the distribution, ischemic colitis should also be considered though the central mesenteric vasculature appears patent on this examination. No evidence of obstruction or perforation. 7 mm hyperdense nodule within the right pericolic gutter adjacent to the cecum possibly the sequela of prior appendectomy. A retained appendicolith with surrounding inflammatory change could result in such an appearance. Aortic Atherosclerosis (ICD10-I70.0). Electronically Signed   By: Fidela Salisbury MD   On: 03/08/2021 22:45   MM 3D SCREEN BREAST BILATERAL  Result Date: 03/02/2021 CLINICAL DATA:  Screening. EXAM: DIGITAL SCREENING BILATERAL MAMMOGRAM WITH TOMOSYNTHESIS AND CAD TECHNIQUE: Bilateral screening digital craniocaudal and mediolateral oblique mammograms were obtained. Bilateral screening digital breast tomosynthesis was performed. The images were evaluated with computer-aided detection. COMPARISON:  Previous exam(s). ACR Breast Density Category b: There are scattered areas of fibroglandular density. FINDINGS: There are no findings suspicious for malignancy. The images were evaluated with computer-aided detection. IMPRESSION: No mammographic evidence of malignancy. A result letter of this screening mammogram will be mailed directly to the patient. RECOMMENDATION: Screening mammogram in one year. (Code:[id number]-B-01Y) BI-RADS CATEGORY  1: Negative. Electronically Signed   By: Kristopher Oppenheim M.D.   On: 03/02/2021 09:47      Subjective: Patient seen  and examined at bedside, resting comfortably.  Spouse present.  Tolerating diet.  No questions or concerns at this time.  Ready for discharge home.  Denies headache, no chest pain, no palpitations, no shortness of breath, no abdominal pain, no weakness, no fatigue, no paresthesias.  No acute events overnight per nursing staff.  Discharge Exam: Vitals:   03/10/21 0636 03/10/21 0826  BP: 109/62 131/76  Pulse: 64 68  Resp: 16   Temp: 98.5 F (36.9 C)   SpO2: 97%    Vitals:   03/09/21 2100 03/10/21 0153 03/10/21 0636 03/10/21 0826  BP: (!) 144/79 123/73 109/62 131/76  Pulse: 65 67 64 68  Resp: 16 16 16    Temp: 98 F (36.7 C) 98.9 F (37.2 C) 98.5 F (36.9 C)   TempSrc: Oral Oral Oral   SpO2: 99% 95% 97%     General: Pt is alert, awake, not in acute distress Cardiovascular: RRR, S1/S2 +, no rubs, no gallops Respiratory: CTA bilaterally, no wheezing, no rhonchi Abdominal: Soft, NT, ND, bowel sounds + Extremities: no edema, no cyanosis    The results of significant diagnostics from this hospitalization (including imaging, microbiology, ancillary and laboratory) are listed below for reference.     Microbiology: Recent Results (from the past 240 hour(s))  SARS CORONAVIRUS 2 (TAT 6-24 HRS) Nasopharyngeal Nasopharyngeal Swab     Status: None   Collection Time: 03/08/21 11:02 PM   Specimen: Nasopharyngeal Swab  Result Value Ref Range Status   SARS Coronavirus 2 NEGATIVE NEGATIVE Final    Comment: (NOTE) SARS-CoV-2 target nucleic acids are NOT DETECTED.  The SARS-CoV-2 RNA is generally detectable in upper and lower respiratory specimens during the acute phase of infection. Negative results do not preclude SARS-CoV-2 infection, do not rule out co-infections with other pathogens, and should not be used as the sole basis for treatment or other patient management decisions. Negative results must be combined with clinical observations, patient history, and epidemiological  information. The expected result is Negative.  Fact Sheet for Patients: SugarRoll.be  Fact Sheet for Healthcare Providers: https://www.woods-mathews.com/  This test is not yet approved or cleared by the [place] FDA and  has been authorized for detection and/or diagnosis of SARS-CoV-2 by FDA under an Emergency Use Authorization (EUA). This EUA will remain  in effect (meaning this test can be used) for the duration of the COVID-19 declaration under Se ction 564(b)(1) of the Act, 21 U.S.C. section 360bbb-3(b)(1), unless the authorization is terminated or revoked sooner.  Performed at [facility], [place] [address] [place], [place] [address]      Labs: BNP (last 3 results) No results for input(s): BNP in the last 8760 hours. Basic Metabolic Panel: Recent Labs  Lab 03/08/21 1944 03/09/21 0324 03/10/21 0319  NA 135 137 140  K 3.4* 4.0 3.3*  CL 97* 98 103  CO2 27 29 28   GLUCOSE 146* 140* 121*  BUN 35* 30* 19  CREATININE 1.18* 1.07* 1.01*  CALCIUM 9.8 9.4 8.8*   Liver Function Tests: Recent Labs  Lab 03/08/21 1944 03/10/21 0319  AST 22 16  ALT 19 14  ALKPHOS 63 42  BILITOT 1.0 1.0  PROT 8.5* 6.5  ALBUMIN 4.4 3.3*   Recent Labs  Lab 03/08/21 1944  LIPASE 27   No results for input(s): AMMONIA in the last 168 hours. CBC: Recent Labs  Lab 03/08/21 1944 03/08/21 2305 03/09/21 0324 03/09/21 2118 03/10/21 0319  WBC 23.4* 20.5* 19.4*  --  12.5*  NEUTROABS  --  18.0*  --   --   --   HGB 14.3 12.9 12.8 12.6 12.4  HCT 43.2 38.8 39.1 38.2 37.8  MCV 92.5 91.9 91.6  --  92.4  PLT 305 260 245  --  225   Cardiac Enzymes: No results for input(s): CKTOTAL, CKMB, CKMBINDEX, TROPONINI in the last 168 hours. BNP: Invalid input(s): POCBNP CBG: Recent Labs  Lab 03/09/21 2101  GLUCAP 151*   D-Dimer No results for input(s): DDIMER in the last 72 hours. Hgb A1c Recent Labs    03/08/21 2305  HGBA1C 6.9*   Lipid  Profile No results for input(s): CHOL, HDL, LDLCALC, TRIG, CHOLHDL, LDLDIRECT in the last 72 hours. Thyroid function studies No results for input(s): TSH, T4TOTAL, T3FREE, THYROIDAB in the last 72 hours.  Invalid input(s): FREET3 Anemia work up No results for input(s): VITAMINB12, FOLATE, FERRITIN, TIBC, IRON, RETICCTPCT in the last 72 hours. Urinalysis    Component Value Date/Time   COLORURINE YELLOW 03/08/2021 1944   APPEARANCEUR CLEAR 03/08/2021 1944   LABSPEC 1.024 03/08/2021 1944   PHURINE 5.0 03/08/2021 1944   GLUCOSEU NEGATIVE 03/08/2021 1944   HGBUR SMALL (A) 03/08/2021 1944   BILIRUBINUR NEGATIVE 03/08/2021 1944   KETONESUR 5 (A) 03/08/2021 1944   PROTEINUR NEGATIVE 03/08/2021 1944   NITRITE NEGATIVE 03/08/2021 1944   LEUKOCYTESUR NEGATIVE 03/08/2021 1944   Sepsis Labs Invalid input(s): PROCALCITONIN,  WBC,  LACTICIDVEN Microbiology Recent Results (  from the past 240 hour(s))  SARS CORONAVIRUS 2 (TAT 6-24 HRS) Nasopharyngeal Nasopharyngeal Swab     Status: None   Collection Time: 03/08/21 11:02 PM   Specimen: Nasopharyngeal Swab  Result Value Ref Range Status   SARS Coronavirus 2 NEGATIVE NEGATIVE Final    Comment: (NOTE) SARS-CoV-2 target nucleic acids are NOT DETECTED.  The SARS-CoV-2 RNA is generally detectable in upper and lower respiratory specimens during the acute phase of infection. Negative results do not preclude SARS-CoV-2 infection, do not rule out co-infections with other pathogens, and should not be used as the sole basis for treatment or other patient management decisions. Negative results must be combined with clinical observations, patient history, and epidemiological information. The expected result is Negative.  Fact Sheet for Patients: SugarRoll.be  Fact Sheet for Healthcare Providers: https://www.woods-mathews.com/  This test is not yet approved or cleared by the [place] FDA and  has been  authorized for detection and/or diagnosis of SARS-CoV-2 by FDA under an Emergency Use Authorization (EUA). This EUA will remain  in effect (meaning this test can be used) for the duration of the COVID-19 declaration under Se ction 564(b)(1) of the Act, 21 U.S.C. section 360bbb-3(b)(1), unless the authorization is terminated or revoked sooner.  Performed at [facility], [place] [address], [place], [place] [address]      Time coordinating discharge: Over 30 minutes  SIGNED:   Chrsitopher Wik J [place] DO  [facility] 03/10/2021, 10:20 AM

## 2021-04-22 ENCOUNTER — Encounter: Payer: Self-pay | Admitting: Gastroenterology

## 2021-04-22 ENCOUNTER — Ambulatory Visit (INDEPENDENT_AMBULATORY_CARE_PROVIDER_SITE_OTHER): Payer: Medicare PPO | Admitting: Gastroenterology

## 2021-04-22 VITALS — BP 140/84 | HR 60 | Ht 64.0 in | Wt 179.4 lb

## 2021-04-22 DIAGNOSIS — K5900 Constipation, unspecified: Secondary | ICD-10-CM

## 2021-04-22 DIAGNOSIS — K529 Noninfective gastroenteritis and colitis, unspecified: Secondary | ICD-10-CM | POA: Diagnosis not present

## 2021-04-22 DIAGNOSIS — R933 Abnormal findings on diagnostic imaging of other parts of digestive tract: Secondary | ICD-10-CM | POA: Diagnosis not present

## 2021-04-22 DIAGNOSIS — Z86010 Personal history of colonic polyps: Secondary | ICD-10-CM | POA: Diagnosis not present

## 2021-04-22 NOTE — Progress Notes (Signed)
HPI :  75 year old female known to me from prior colonoscopy exams, here for a follow-up visit for colitis.  She states she was in her usual state of health, ate lunch on March 28 and about 1 hour later while she was in a store developed severe diarrhea along with some left lower quadrant abdominal pain and felt lightheaded.  She returned home where she had further episodes of diarrhea and saw what she thought was some old dark blood, small amount.  She went to urgent care who then referred her to the emergency department at [facility].  She was found to have left-sided colitis on CT scan.  She has significant leukocytosis with white blood cell count into the 20s as well as an AKI.  She was given fluids and antibiotics and states she felt much better rather quickly.  She states the pain was never severe, rated 4-5 out of 10.  While she did have some blood in her stool during this time she states it was not significant.  She denies any new medication changes during this time, denied any NSAIDs.  She was in the hospital for a few days and then discharged.  She completed a course of Augmentin.  She did not have any stool studies sent for infection.  She states her symptoms have completely resolved.  She has no further abdominal pain at all.  Her bowel habits have improved.  In fact she is now having constipation which has bothered her.  She states in the past she has been so constipated she had to use her fingers to help disimpact herself.  She wonders if there is a problem with her pelvic floor and isolated and inquires about options.  Her last colonoscopy with me was in 2020, few small adenomas removed.  She also had diverticulosis of the left colon with luminal narrowing.  She has never had known ischemic colitis or infectious colitis.  She had patent vasculature on CT scan.  No first-degree family members with history of colon cancer.  CT scan abdomen / pelvis with contrast 03/08/21 :IMPRESSION: Long  segment inflammatory changes involving the descending and sigmoid colon in keeping with an infectious or inflammatory colitis. Given the distribution, ischemic colitis should also be considered though the central mesenteric vasculature appears patent on this examination. No evidence of obstruction or perforation.  7 mm hyperdense nodule within the right pericolic gutter adjacent to the cecum possibly the sequela of prior appendectomy. A retained appendicolith with surrounding inflammatory change could result in such an appearance.  Aortic Atherosclerosis (ICD10-I70.0).   Colonoscopy 08/30/19 - The perianal and digital rectal examinations were normal. - Two sessile polyps were found in the cecum. The polyps were 3 to 4 mm in size. These polyps were removed with a cold snare. Resection and retrieval were complete. - A 5 mm polyp was found in the hepatic flexure. The polyp was sessile. The polyp was removed with a cold snare. Resection and retrieval were complete. - A diminutive polyp was found in the recto-sigmoid colon. The polyp was sessile. The polyp was removed with a cold snare. Resection and retrieval were complete. - Multiple small-mouthed diverticula were found in the sigmoid colon. - The colon was tortuous with a severely restricted rectosigmoid / distal sigmoid colon. Pediatric colonoscope used to traverse the colon. - The exam was otherwise without abnormality.  Surgical [P], colon, cecum, hepatic flexure, rectosigmoid, polyp (3) - TUBULAR ADENOMA, NEGATIVE FOR HIGH GRADE DYSPLASIA (X MULTIPLE FRAGMENTS).  Past Medical History:  Diagnosis Date  . Allergy   . Anemia   . Arthritis    shoulder  . Asthma   . Cancer ([place])    skin cancer  . Cataract    small forming   . Diabetes mellitus ([place])   . Diverticulosis   . GERD (gastroesophageal reflux disease)   . Heart murmur   . Hyperlipidemia   . Hypertension   . Osteoporosis      Past Surgical History:   Procedure Laterality Date  . ABDOMINAL HYSTERECTOMY    . APPENDECTOMY    . COLONOSCOPY    . POLYPECTOMY  2005   TA polyp   Family History  Problem Relation Age of Onset  . Stroke Mother   . Stroke Father   . Heart disease Father   . Hypertension Father   . Colon cancer Maternal Grandfather   . Breast cancer Neg Hx   . Colon polyps Neg Hx   . Esophageal cancer Neg Hx   . Stomach cancer Neg Hx   . Rectal cancer Neg Hx    Social History   Tobacco Use  . Smoking status: Never Smoker  . Smokeless tobacco: Never Used  Substance Use Topics  . Alcohol use: Yes    Alcohol/week: 0.0 standard drinks    Comment: 1 glass a month - rare   . Drug use: Never   Current Outpatient Medications  Medication Sig Dispense Refill  . acetaminophen (TYLENOL) 500 MG tablet Take 500 mg by mouth every 6 (six) hours as needed for mild pain, fever or headache.    . albuterol (VENTOLIN HFA) 108 (90 Base) MCG/ACT inhaler Inhale 1 puff into the lungs every 6 (six) hours as needed for wheezing or shortness of breath.    Marland Kitchen atenolol-chlorthalidone (TENORETIC) 100-25 MG per tablet Take 1 tablet by mouth daily.    . [employer] Lancets Micro Thin 33G MISC daily. for testing    . losartan (COZAAR) 100 MG tablet Take 100 mg by mouth daily.    Angelia Mould TEST test strip CHECK DAILY BLOOD SUGAR    . calcium-vitamin D (OSCAL WITH D) 250-125 MG-UNIT tablet Take 1 tablet by mouth daily. (Patient not taking: Reported on 04/22/2021)     No current facility-administered medications for this visit.   Allergies  Allergen Reactions  . Prednisone Other (See Comments)    REACTION: Hyperactivity     Review of Systems: All systems reviewed and negative except where noted in HPI.   Lab Results  Component Value Date   WBC 12.5 (H) 03/10/2021   HGB 12.4 03/10/2021   HCT 37.8 03/10/2021   MCV 92.4 03/10/2021   PLT 225 03/10/2021    Lab Results  Component Value Date   CREATININE 1.01 (H) 03/10/2021   BUN 19 03/10/2021    NA 140 03/10/2021   K 3.3 (L) 03/10/2021   CL 103 03/10/2021   CO2 28 03/10/2021    Lab Results  Component Value Date   ALT 14 03/10/2021   AST 16 03/10/2021   ALKPHOS 42 03/10/2021   BILITOT 1.0 03/10/2021     Physical Exam: BP 140/84 (BP Location: Left Arm, Patient Position: Sitting, Cuff Size: Normal)   Pulse 60   Ht 5\' 4"  (1.626 m) Comment: height measured without shoes  Wt 179 lb 6 oz (81.4 kg)   BMI 30.79 kg/m  Constitutional: Pleasant,well-developed, female in no acute distress. Abdominal: Soft, nondistended, nontender.  There are no masses palpable. No hepatomegaly.  DRE - normal perianal exam. No mass lesions, normal tone and decent Extremities: no edema Lymphadenopathy: No cervical adenopathy noted. Neurological: Alert and oriented to person place and time. Skin: Skin is warm and dry. No rashes noted. Psychiatric: Normal mood and affect. Behavior is normal.   ASSESSMENT AND PLAN: 75 year old female here for reassessment of following:  Abnormal CT scan of the colon Colitis Constipation History of colon polyps  Reviewed the patient's history and CT scan with her.  Her symptoms have completely resolved following brief hospitalization and course of antibiotics.  Discussed differential diagnosis with her which is namely infectious versus ischemic colitis.  Her pain was rather mild and had fairly mild bleeding as well, overall I suspect more than likely infectious colitis rather than ischemic, although ischemic is possible.  No stool studies were done to help clarify.  I discussed with her that if she has recurrence of symptoms she should contact me, if she had ischemic colitis she has at risk for recurrence.  No risk factors for this other than diuretic use but she did not think she was dehydrated prior to onset of symptoms.  Moving forward, she is currently having problems with constipation, using only stool softener as needed.  DRE shows no evidence of pelvic floor  dysfunction or rectal abnormalities.  We discussed options.  Recommend MiraLAX once daily and titrate up as needed with goal for 1 soft bowel movement per day without straining.  If this does not help her she should contact me for reassessment.  We otherwise discussed her history of colon polyps, multiple adenomas on her last exam, she would be due for surveillance next year if she wishes to continue surveillance at that age.  Following discussion of risks and benefits she strongly wishes to have more colonoscopy prior to stopping, she will see [place] next year for colonoscopy.  Plan: - contact me with any recurrence of symptoms.  Favor infectious colitis caused prior presentation although ischemic colitis was possible - miraLAX daily for now and titrate up as needed, discussed with her how to do this - plan for colonoscopy September 2023  [place] Cellar, MD [facility]

## 2021-04-22 NOTE — Patient Instructions (Addendum)
If you are age 75 or older, your body mass index should be between 23-30. Your Body mass index is 30.79 kg/m. If this is out of the aforementioned range listed, please consider follow up with your Primary Care Provider.  If you are age 67 or younger, your body mass index should be between 19-25. Your Body mass index is 30.79 kg/m. If this is out of the aformentioned range listed, please consider follow up with your Primary Care Provider.   Please purchase the following medications over the counter and take as directed: Miralax - Take as directed once daily and increase as needed  Stop taking stool softeners.  Follow up as needed.  You will be due for a recall colonoscopy in 08-2022. We will send you a reminder in the mail when it gets closer to that time.   Thank you for entrusting me with your care and for choosing [facility], Dr. Ostrander Cellar   .

## 2022-03-24 ENCOUNTER — Other Ambulatory Visit: Payer: Self-pay | Admitting: Internal Medicine

## 2022-03-24 DIAGNOSIS — M8580 Other specified disorders of bone density and structure, unspecified site: Secondary | ICD-10-CM

## 2022-04-08 ENCOUNTER — Other Ambulatory Visit: Payer: Self-pay | Admitting: Internal Medicine

## 2022-04-08 DIAGNOSIS — Z1231 Encounter for screening mammogram for malignant neoplasm of breast: Secondary | ICD-10-CM

## 2022-04-21 ENCOUNTER — Ambulatory Visit
Admission: RE | Admit: 2022-04-21 | Discharge: 2022-04-21 | Disposition: A | Discharge status: Home / Self Care | Payer: Medicare (Managed Care) | Source: Ambulatory Visit | Attending: Internal Medicine | Admitting: Internal Medicine

## 2022-04-21 DIAGNOSIS — Z1231 Encounter for screening mammogram for malignant neoplasm of breast: Secondary | ICD-10-CM

## 2022-05-07 IMAGING — CT CT CARDIAC CORONARY ARTERY CALCIUM SCORE
3 series · 13 of 20 positions shown, 15 images · non-contrast
Comparison: No priors.

CLINICAL DATA: 74-year-old Caucasian female with history of high
cholesterol and hypertension, as well as family history of coronary
artery disease.

EXAM:
CT CARDIAC CORONARY ARTERY CALCIUM SCORE
TECHNIQUE: Non-contrast imaging through the heart was performed using
prospective ECG gating. Image post processing was performed on an
independent workstation, allowing for quantitative analysis of the
heart and coronary arteries. Note that this exam targets the heart
and the chest was not imaged in its entirety.

[Series 2: calcium scoring 2.00 qr36 bestdiast 70% hrt calciu · axial · 0.39mm/px · z∈[+1510,+1558]mm · 3 of 60 slices shown]
[im 12/60  vessel]
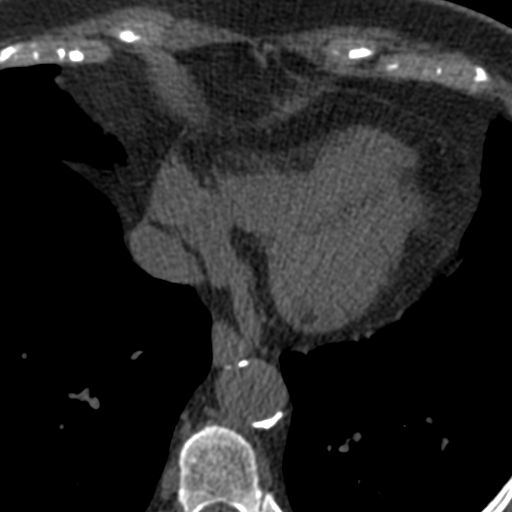
[im 24/60  vessel]
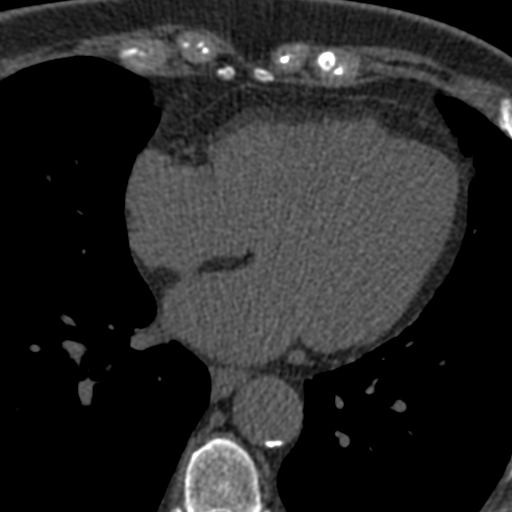
[im 36/60  vessel]
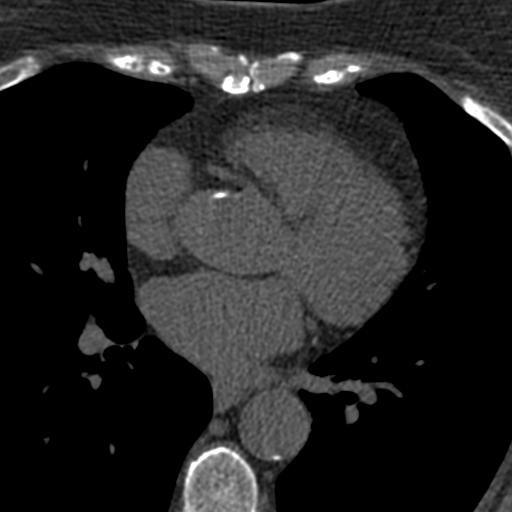

[Series 3: calcium scoring 2.00 br40 bestdiast 70% axial · axial · 0.59mm/px · z∈[+1506,+1586]mm · 5 of 60 slices shown, 7 images]
[im 10/60  vessel]
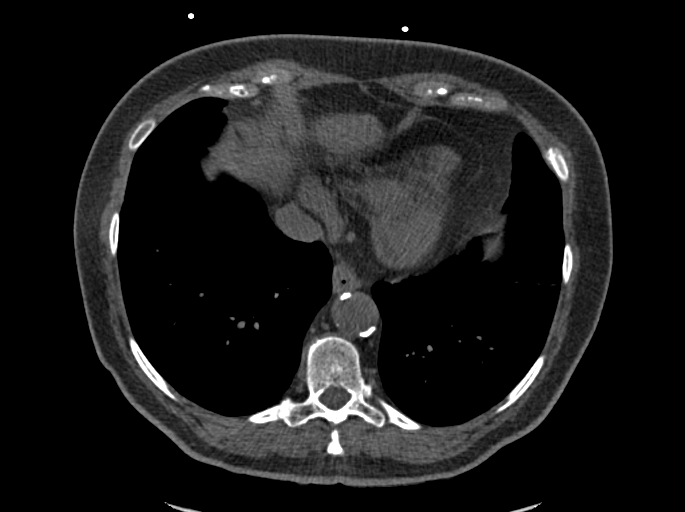
[im 10/60  lung]
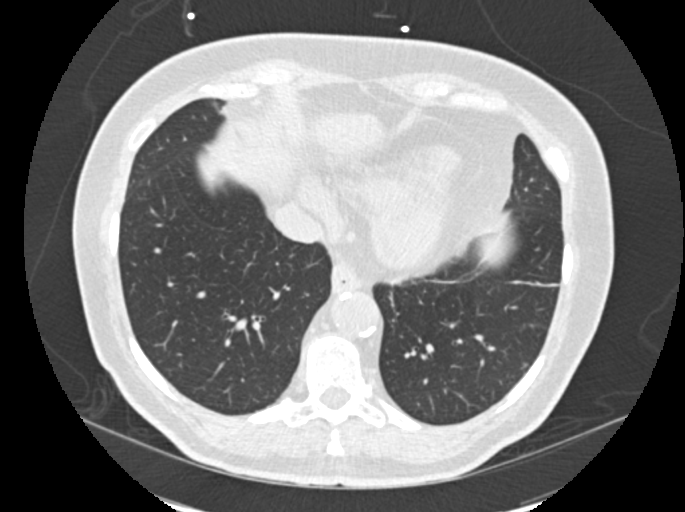
[im 20/60  vessel]
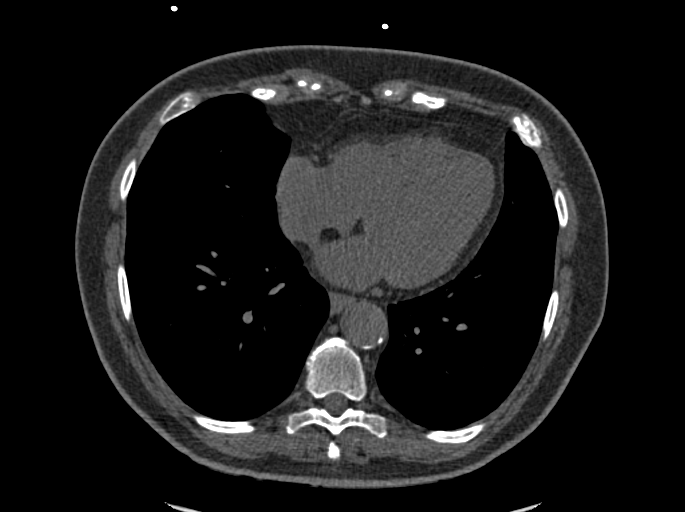
[im 30/60  vessel]
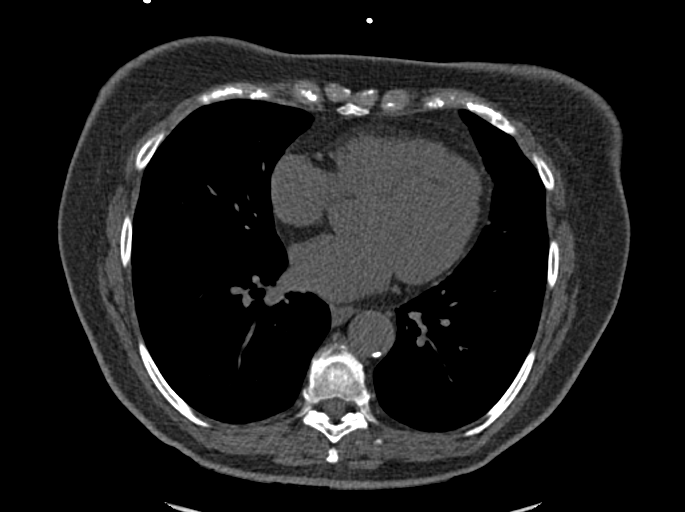
[im 40/60  vessel]
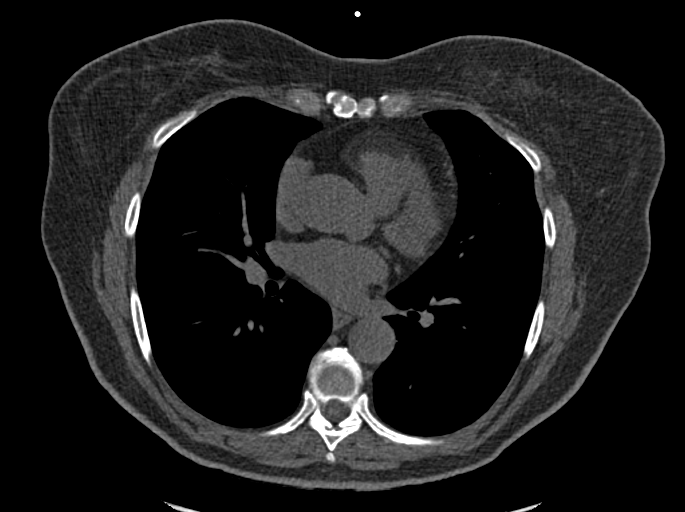
[im 50/60  vessel]
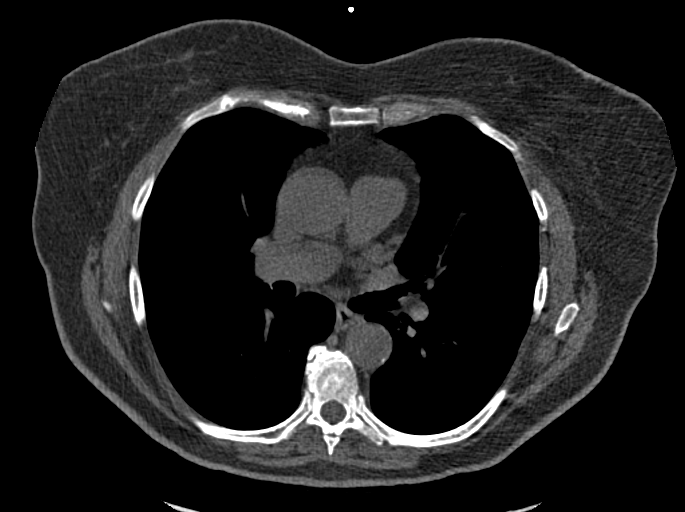
[im 50/60  lung]
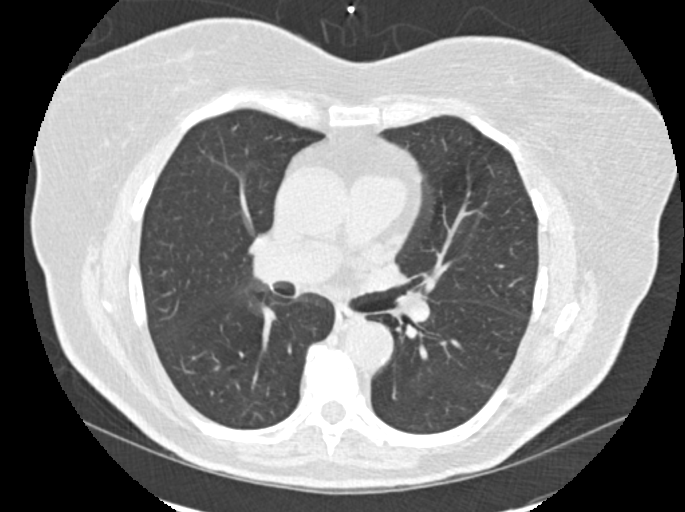

[Series 9: calcium scoring 2.00 br60 bestdiast 70% lungs · axial · 0.59mm/px · z∈[+1506,+1586]mm · 5 of 60 slices shown]
[im 10/60  vessel]
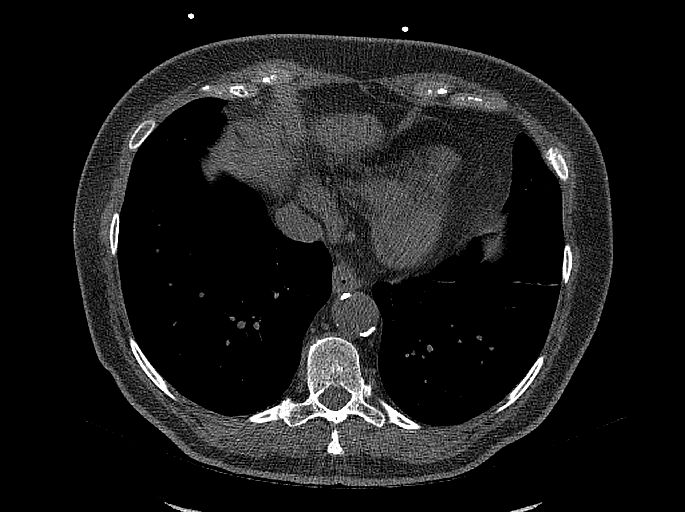
[im 20/60  vessel]
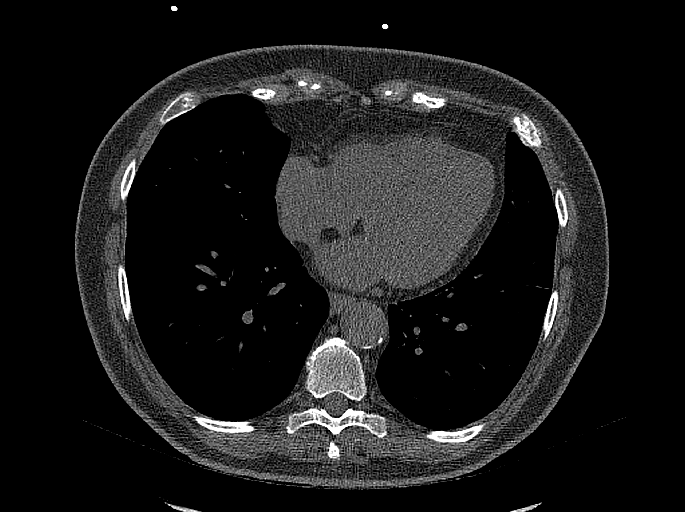
[im 30/60  vessel]
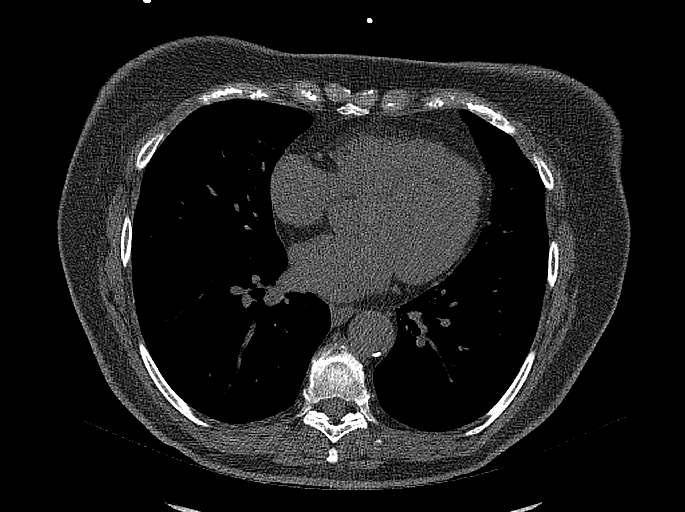
[im 40/60  vessel]
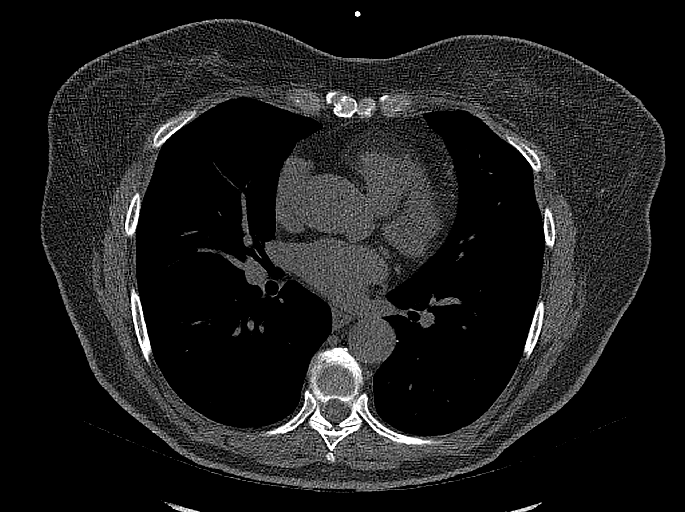
[im 50/60  vessel]
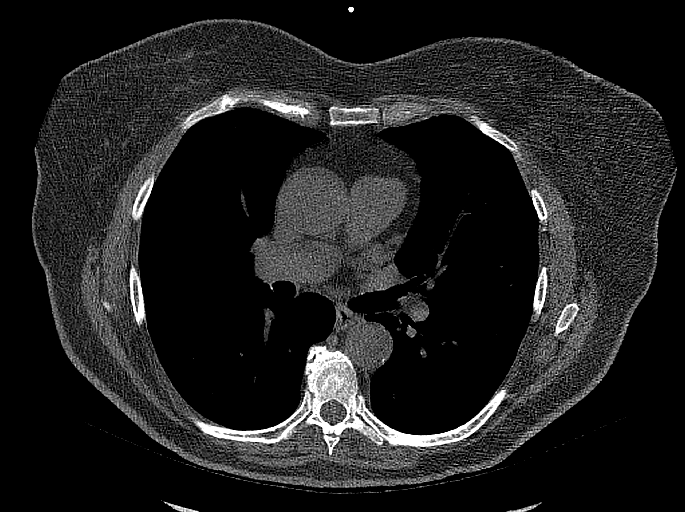

[13 of 20 positions shown; findings below may reference images not displayed]

FINDINGS: CORONARY CALCIUM SCORES:

Left Main: 0

LAD: 3

LCx: 0

RCA: 0

Total Agatston Score: 3

[HOSPITAL] percentile: 31st

AORTA MEASUREMENTS:

Ascending Aorta: 38 mm

Descending Aorta: 27 mm

EXTRACARDIAC FINDINGS:

Aortic atherosclerosis. Within the visualized portions of the thorax
there are no suspicious appearing pulmonary nodules or masses, there
is no acute consolidative airspace disease, no pleural effusions, no
pneumothorax and no lymphadenopathy. Visualized portions of the
upper abdomen are unremarkable. There are no aggressive appearing
lytic or blastic lesions noted in the visualized portions of the
skeleton.
IMPRESSION: 1. Patient's total coronary artery calcium score is 3 which is 31st
percentile for patient's of matched age, gender and race/ethnicity.
2. Aortic Atherosclerosis ([id number]).

## 2022-07-04 ENCOUNTER — Encounter: Payer: Self-pay | Admitting: Gastroenterology

## 2023-02-04 ENCOUNTER — Emergency Department (HOSPITAL_COMMUNITY)
Admission: EM | Admit: 2023-02-04 | Discharge: 2023-02-05 | Disposition: A | Discharge status: Home / Self Care | Payer: Medicare (Managed Care) | Attending: Emergency Medicine | Admitting: Emergency Medicine

## 2023-02-04 ENCOUNTER — Other Ambulatory Visit: Payer: Self-pay

## 2023-02-04 DIAGNOSIS — S8992XA Unspecified injury of left lower leg, initial encounter: Secondary | ICD-10-CM | POA: Diagnosis present

## 2023-02-04 DIAGNOSIS — W06XXXA Fall from bed, initial encounter: Secondary | ICD-10-CM | POA: Diagnosis not present

## 2023-02-04 DIAGNOSIS — S80812A Abrasion, left lower leg, initial encounter: Secondary | ICD-10-CM | POA: Insufficient documentation

## 2023-02-04 DIAGNOSIS — S93402A Sprain of unspecified ligament of left ankle, initial encounter: Secondary | ICD-10-CM

## 2023-02-04 DIAGNOSIS — W19XXXA Unspecified fall, initial encounter: Secondary | ICD-10-CM

## 2023-02-04 DIAGNOSIS — I10 Essential (primary) hypertension: Secondary | ICD-10-CM | POA: Diagnosis not present

## 2023-02-04 NOTE — ED Triage Notes (Signed)
Pt had trip and fall while going to bed.  Bed has steps up to it as it is elevated.  Pt has abrasion and redness to left shin and is concerned about a red hematoma on the back of that same calf.  Pt has had recent tetanus shot. No thinners. Did not hit head.

## 2023-02-05 ENCOUNTER — Emergency Department (HOSPITAL_COMMUNITY): Payer: Medicare (Managed Care)

## 2023-02-05 MED ORDER — ACETAMINOPHEN 325 MG PO TABS
650.0000 mg | ORAL_TABLET | Freq: Once | ORAL | Status: AC
Start: 2023-02-05 — End: 2023-02-05
  Administered 2023-02-05: 650 mg via ORAL
  Filled 2023-02-05: qty 2

## 2023-02-05 NOTE — ED Provider Notes (Signed)
[facility] Provider Note   CSN: [id number] Arrival date & time: 02/04/23  2252     History  Chief Complaint  Patient presents with   Debra Galvan is a 77 y.o. female.  The history is provided by the patient.  Fall  Debra Galvan is a 77 y.o. female who presents to the Emergency Department complaining of fall.  She presents to the emergency department for evaluation of injury following a fall that occurred around 1005 this evening.  She states that she was attempting to get into bed and walking up 2 steps to get to her raised bed and slipped and fell and scraped her leg.  No head injury or loss of consciousness.  She complains of pain to the anterior and lateral left leg as well as the left ankle.  She was able to get herself back up and ambulate since then.   Hx/o HTN, prediabetes.  No blood thinners.  Tetanus utd.       Home Medications Prior to Admission medications   Medication Sig Start Date End Date Taking? Authorizing Provider  acetaminophen (TYLENOL) 500 MG tablet Take 500 mg by mouth every 6 (six) hours as needed for mild pain, fever or headache.    [provider]  albuterol (VENTOLIN HFA) 108 (90 Base) MCG/ACT inhaler Inhale 1 puff into the lungs every 6 (six) hours as needed for wheezing or shortness of breath. 03/10/21   [place] Donnamarie Poag, DO  atenolol-chlorthalidone (TENORETIC) 100-25 MG per tablet Take 1 tablet by mouth daily.    [provider]  calcium-vitamin D (OSCAL WITH D) 250-125 MG-UNIT tablet Take 1 tablet by mouth daily. Patient not taking: Reported on 04/22/2021    [provider]  [employer] Lancets Micro Thin 33G MISC daily. for testing 07/29/19   [provider]  losartan (COZAAR) 100 MG tablet Take 100 mg by mouth daily.    [provider]  [place] TEST test strip CHECK DAILY BLOOD SUGAR 07/29/19   [provider]      Allergies    Prednisone     Review of Systems   Review of Systems  All other systems reviewed and are negative.   Physical Exam Updated Vital Signs BP (!) 159/85 (BP Location: Right Arm)   Pulse 69   Temp 98.2 F (36.8 C) (Oral)   Resp 18   Ht '5\' 4"'$  (1.626 m)   Wt 81.6 kg   SpO2 98%   BMI 30.90 kg/m  Physical Exam Vitals and nursing note reviewed.  Constitutional:      Appearance: She is well-developed.  HENT:     Head: Normocephalic and atraumatic.  Cardiovascular:     Rate and Rhythm: Normal rate and regular rhythm.  Pulmonary:     Effort: Pulmonary effort is normal. No respiratory distress.  Abdominal:     Palpations: Abdomen is soft.     Tenderness: There is no abdominal tenderness. There is no guarding or rebound.  Musculoskeletal:     Comments: 2+ DP pulses bilaterally.  There is an abrasion to the left anterior shin.  There is mild tenderness over the left proximal tibia and left lateral malleolus.  Flexion/extension intact at knee, ankle.    Skin:    General: Skin is warm and dry.  Neurological:     Mental Status: She is alert and oriented to person, place, and time.  Psychiatric:  Behavior: Behavior normal.     ED Results / Procedures / Treatments   Labs (all labs ordered are listed, but only abnormal results are displayed) Labs Reviewed - No data to display  EKG None  Radiology DG Ankle Complete Left  Result Date: 02/05/2023 CLINICAL DATA:  [place] today with trauma over the shin. EXAM: LEFT ANKLE COMPLETE - 3+ VIEW; LEFT TIBIA AND FIBULA - 2 VIEW COMPARISON:  None Available. FINDINGS: AP Lat left tibia fibula: Normal bone mineralization. No evidence of fractures or other focal bone abnormality. There is mild degenerative arthrosis and meniscal chondrocalcinosis of the knee. There is mild pretibial soft tissue edema in the upper to mid foreleg. Left ankle, three views: There is mild circumferential soft tissue swelling, with extension into the foot. The mortise is symmetric.  There is a small ossicle alongside the medial malleolus most likely due to old trauma. No acute fracture is evident. There are sizable dorsal and plantar calcaneal noninflammatory enthesophytes. Mild midfoot arthrosis. IMPRESSION: 1. No evidence of acute fractures. 2. Pretibial soft tissue edema in the upper to mid foreleg. 3. Mild circumferential soft tissue swelling of the ankle with extension into the foot. 4. Calcaneal enthesopathy. 5. 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 Electronically Signed   By: Telford Nab M.D.   On: 02/05/2023 01:20   DG Tibia/Fibula Left  Result Date: 02/05/2023 CLINICAL DATA:  [place] today with trauma over the shin. EXAM: LEFT ANKLE COMPLETE - 3+ VIEW; LEFT TIBIA AND FIBULA - 2 VIEW COMPARISON:  None Available. FINDINGS: AP Lat left tibia fibula: Normal bone mineralization. No evidence of fractures or other focal bone abnormality. There is mild degenerative arthrosis and meniscal chondrocalcinosis of the knee. There is mild pretibial soft tissue edema in the upper to mid foreleg. Left ankle, three views: There is mild circumferential soft tissue swelling, with extension into the foot. The mortise is symmetric. There is a small ossicle alongside the medial malleolus most likely due to old trauma. No acute fracture is evident. There are sizable dorsal and plantar calcaneal noninflammatory enthesophytes. Mild midfoot arthrosis. IMPRESSION: 1. No evidence of acute fractures. 2. Pretibial soft tissue edema in the upper to mid foreleg. 3. Mild circumferential soft tissue swelling of the ankle with extension into the foot. 4. Calcaneal enthesopathy. 5. 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 Electronically Signed   By: Telford Nab M.D.   On: 02/05/2023 01:20    Procedures Procedures    Medications Ordered in ED Medications  acetaminophen (TYLENOL) tablet 650 mg (650 mg Oral Given 02/05/23 0042)    ED Course/ Medical Decision Making/ A&P                              Medical Decision Making Amount and/or Complexity of Data Reviewed Radiology: ordered.  Risk OTC drugs.   Patient here for evaluation of injuries following a mechanical fall.  She has an abrasion to the anterior left shin that is not amenable to suturing.  She also has some soft tissue fullness to the lateral leg as well as the lateral malleolus.  Images are negative for acute fracture or dislocation-images personally reviewed and interpreted, agree with radiologist interpretation.  Discussed with patient home care with weightbearing as tolerated, local wound care.  She may take acetaminophen as needed for pain.  Discussed outpatient follow-up and return precautions.        Final Clinical Impression(s) / ED Diagnoses Final diagnoses:  None  Rx / DC Orders ED Discharge Orders     None         Quintella Reichert, MD 02/05/23 [phone]

## 2023-03-08 ENCOUNTER — Other Ambulatory Visit: Payer: Self-pay | Admitting: Internal Medicine

## 2023-03-08 DIAGNOSIS — Z0000 Encounter for general adult medical examination without abnormal findings: Secondary | ICD-10-CM

## 2023-04-24 ENCOUNTER — Ambulatory Visit
Admission: RE | Admit: 2023-04-24 | Discharge: 2023-04-24 | Disposition: A | Discharge status: Home / Self Care | Payer: Medicare (Managed Care) | Source: Ambulatory Visit | Attending: Internal Medicine | Admitting: Internal Medicine

## 2023-04-24 DIAGNOSIS — Z0000 Encounter for general adult medical examination without abnormal findings: Secondary | ICD-10-CM

## 2024-04-01 ENCOUNTER — Other Ambulatory Visit: Payer: Self-pay | Admitting: Internal Medicine

## 2024-04-01 DIAGNOSIS — Z1231 Encounter for screening mammogram for malignant neoplasm of breast: Secondary | ICD-10-CM

## 2024-04-17 ENCOUNTER — Encounter (HOSPITAL_COMMUNITY): Payer: Self-pay

## 2024-04-24 ENCOUNTER — Ambulatory Visit
Admission: RE | Admit: 2024-04-24 | Discharge: 2024-04-24 | Disposition: A | Discharge status: Home / Self Care | Source: Ambulatory Visit | Attending: Internal Medicine | Admitting: Internal Medicine

## 2024-04-24 DIAGNOSIS — Z1231 Encounter for screening mammogram for malignant neoplasm of breast: Secondary | ICD-10-CM

## 2024-07-13 ENCOUNTER — Emergency Department (HOSPITAL_COMMUNITY)

## 2024-07-13 ENCOUNTER — Emergency Department (HOSPITAL_COMMUNITY)
Admission: EM | Admit: 2024-07-13 | Discharge: 2024-07-13 | Disposition: A | Discharge status: Home / Self Care | Attending: Emergency Medicine | Admitting: Emergency Medicine

## 2024-07-13 DIAGNOSIS — F410 Panic disorder [episodic paroxysmal anxiety] without agoraphobia: Secondary | ICD-10-CM | POA: Diagnosis present

## 2024-07-13 DIAGNOSIS — Z79899 Other long term (current) drug therapy: Secondary | ICD-10-CM | POA: Insufficient documentation

## 2024-07-13 DIAGNOSIS — I2510 Atherosclerotic heart disease of native coronary artery without angina pectoris: Secondary | ICD-10-CM | POA: Diagnosis not present

## 2024-07-13 DIAGNOSIS — J45909 Unspecified asthma, uncomplicated: Secondary | ICD-10-CM | POA: Diagnosis not present

## 2024-07-13 DIAGNOSIS — R7989 Other specified abnormal findings of blood chemistry: Secondary | ICD-10-CM | POA: Diagnosis not present

## 2024-07-13 DIAGNOSIS — Z85828 Personal history of other malignant neoplasm of skin: Secondary | ICD-10-CM | POA: Diagnosis not present

## 2024-07-13 DIAGNOSIS — I700 Atherosclerosis of aorta: Secondary | ICD-10-CM | POA: Insufficient documentation

## 2024-07-13 DIAGNOSIS — J811 Chronic pulmonary edema: Secondary | ICD-10-CM | POA: Diagnosis not present

## 2024-07-13 DIAGNOSIS — E119 Type 2 diabetes mellitus without complications: Secondary | ICD-10-CM | POA: Insufficient documentation

## 2024-07-13 DIAGNOSIS — R0902 Hypoxemia: Secondary | ICD-10-CM | POA: Diagnosis not present

## 2024-07-13 DIAGNOSIS — I10 Essential (primary) hypertension: Secondary | ICD-10-CM | POA: Insufficient documentation

## 2024-07-13 DIAGNOSIS — E038 Other specified hypothyroidism: Secondary | ICD-10-CM | POA: Insufficient documentation

## 2024-07-13 DIAGNOSIS — M109 Gout, unspecified: Secondary | ICD-10-CM | POA: Insufficient documentation

## 2024-07-13 HISTORY — DX: Gout, unspecified: M10.9

## 2024-07-13 HISTORY — DX: Other specified hypothyroidism: E03.8

## 2024-07-13 LAB — BASIC METABOLIC PANEL WITH GFR
Anion gap: 11 (ref 5–15)
BUN: 21 mg/dL (ref 8–23)
CO2: 27 mmol/L (ref 22–32)
Calcium: 9.3 mg/dL (ref 8.9–10.3)
Chloride: 102 mmol/L (ref 98–111)
Creatinine, Ser: 1.07 mg/dL — ABNORMAL HIGH (ref 0.44–1.00)
GFR, Estimated: 53 mL/min — ABNORMAL LOW (ref >60)
Glucose, Bld: 175 mg/dL — ABNORMAL HIGH (ref 70–99)
Potassium: 3.6 mmol/L (ref 3.5–5.1)
Sodium: 140 mmol/L (ref 135–145)

## 2024-07-13 LAB — RESP PANEL BY RT-PCR (RSV, FLU A&B, COVID)  RVPGX2
Influenza A by PCR: NEGATIVE
Influenza B by PCR: NEGATIVE
Resp Syncytial Virus by PCR: NEGATIVE
SARS Coronavirus 2 by RT PCR: NEGATIVE

## 2024-07-13 LAB — CBC
HCT: 41.8 % (ref 36.0–46.0)
Hemoglobin: 13.3 g/dL (ref 12.0–15.0)
MCH: 29.8 pg (ref 26.0–34.0)
MCHC: 31.8 g/dL (ref 30.0–36.0)
MCV: 93.7 fL (ref 80.0–100.0)
Platelets: 279 K/uL (ref 150–400)
RBC: 4.46 MIL/uL (ref 3.87–5.11)
RDW: 12.3 % (ref 11.5–15.5)
WBC: 19.7 K/uL — ABNORMAL HIGH (ref 4.0–10.5)
nRBC: 0 % (ref 0.0–0.2)

## 2024-07-13 LAB — BRAIN NATRIURETIC PEPTIDE: B Natriuretic Peptide: 139.4 pg/mL — ABNORMAL HIGH (ref 0.0–100.0)

## 2024-07-13 MED ORDER — FUROSEMIDE 20 MG PO TABS: 20.0000 mg | ORAL_TABLET | Freq: Every day | ORAL | 0 refills | Status: DC

## 2024-07-13 MED ORDER — IOHEXOL 350 MG/ML SOLN
75.0000 mL | Freq: Once | INTRAVENOUS | Status: AC | PRN
  Administered 2024-07-13: 75 mL via INTRAVENOUS

## 2024-07-13 MED ORDER — POTASSIUM CHLORIDE CRYS ER 20 MEQ PO TBCR: 20.0000 meq | EXTENDED_RELEASE_TABLET | Freq: Every day | ORAL | 0 refills | Status: DC

## 2024-07-13 MED ORDER — POTASSIUM CHLORIDE CRYS ER 20 MEQ PO TBCR
20.0000 meq | EXTENDED_RELEASE_TABLET | Freq: Once | ORAL | Status: AC
  Administered 2024-07-13: 20 meq via ORAL
  Filled 2024-07-13: qty 1

## 2024-07-13 MED ORDER — IPRATROPIUM-ALBUTEROL 0.5-2.5 (3) MG/3ML IN SOLN
3.0000 mL | Freq: Once | RESPIRATORY_TRACT | Status: AC
  Administered 2024-07-13: 3 mL via RESPIRATORY_TRACT
  Filled 2024-07-13: qty 3

## 2024-07-13 MED ORDER — FUROSEMIDE 40 MG PO TABS
20.0000 mg | ORAL_TABLET | Freq: Once | ORAL | Status: AC
  Administered 2024-07-13: 20 mg via ORAL
  Filled 2024-07-13: qty 1

## 2024-07-13 NOTE — ED Provider Notes (Signed)
 [place] EMERGENCY DEPARTMENT AT [facility] Provider Note   CSN: [id number] Arrival date & time: 07/13/24  1219     Patient presents with: No chief complaint on file.   Debra  SAYLA Galvan is a 78 y.o. female.   HPI Patient presents after reported panic attack.  Reported was in an argument with her son.  States her son told her she was horrible person.  Not really feeling short of breath but does not remember a whole lot about it.  Feeling better now.  However has had episodes of hypoxia here.  Sats down in the 80s.  No chest pain.  No trouble breathing.  No cough.  No fevers.  No swelling in her legs.  Per EMS patient was reportedly in A-fib.  However reviewing EMS EKG it appears as if it is a sinus rhythm.       Past Medical History:  Diagnosis Date   Allergy    Anemia    Arthritis    shoulder   Asthma    Cancer ([facility])    skin cancer   Cataract    small forming    Diabetes mellitus ([facility])    Diverticulosis    GERD (gastroesophageal reflux disease)    Heart murmur    Hyperlipidemia    Hypertension    Osteoporosis     Prior to Admission medications   Medication Sig Start Date End Date Taking? Authorizing Provider  acetaminophen  (TYLENOL ) 500 MG tablet Take 500 mg by mouth every 6 (six) hours as needed for mild pain, fever or headache.    [provider]  albuterol  (VENTOLIN  HFA) 108 (90 Base) MCG/ACT inhaler Inhale 1 puff into the lungs every 6 (six) hours as needed for wheezing or shortness of breath. 03/10/21   [place], Camellia PARAS, DO  atenolol -chlorthalidone  (TENORETIC ) 100-25 MG per tablet Take 1 tablet by mouth daily.    [provider]  calcium -vitamin D (OSCAL WITH D) 250-125 MG-UNIT tablet Take 1 tablet by mouth daily. Patient not taking: Reported on 04/22/2021    [provider]  [employer] Lancets Micro Thin 33G MISC daily. for testing 07/29/19   [provider]  losartan  (COZAAR ) 100 MG tablet Take 100 mg by mouth daily.     [provider]  TRUETRACK TEST test strip CHECK DAILY BLOOD SUGAR 07/29/19   [provider]    Allergies: Prednisone    Review of Systems  Updated Vital Signs BP 123/80 (BP Location: Left Arm)   Pulse 64   Temp 97.9 F (36.6 C) (Oral)   Resp 18   SpO2 92%   Physical Exam Vitals and nursing note reviewed.  HENT:     Head: Atraumatic.  Cardiovascular:     Rate and Rhythm: Regular rhythm.  Pulmonary:     Breath sounds: No wheezing or rales.  Abdominal:     Tenderness: There is no abdominal tenderness.  Musculoskeletal:     Cervical back: Neck supple.  Skin:    General: Skin is warm.  Neurological:     Mental Status: She is alert and oriented to person, place, and time.     (all labs ordered are listed, but only abnormal results are displayed) Labs Reviewed  BASIC METABOLIC PANEL WITH GFR  CBC    EKG: None  Radiology: No results found.   Procedures   Medications Ordered in the ED - No data to display  Medical Decision Making Amount and/or Complexity of Data Reviewed Labs: ordered. Radiology: ordered.   Patient got an argument with son and reportedly had a panic attack.  Feeling better now.  However does have hypoxia.  Sats go down to 85%.  Did have good waveform.  Blood work reassuring.  Chest x-ray reassuring.  CT angiography done to evaluate for cause such as pulmonary embolism.  Care turned over to Dr.Naasz     Final diagnoses:  None    ED Discharge Orders     None          Patsey Lot, MD 07/13/24 [phone]

## 2024-07-13 NOTE — ED Triage Notes (Signed)
 Patient BIB EMS for panic attack. Patient from home. Patient got in argument /emotional conversation with her son and started to have a panic attack per EMS. Patient does not remember having a panic attack, has calmed down and is tired now. Patient calm at triage. Patient denies pain.      EMS: Patient in A Fib, patient denies having hx of a fib 130/90 P 60-80 98% RA R 19 Denies pain  IV L AC 20g

## 2024-07-13 NOTE — ED Provider Notes (Signed)
 3:18 PM Assumed care of patient from off-going team. For more details, please see note from same day.  In brief, this is a 78 y.o. female who presented d/t panic attack after having an argument with her son. Found to be hypoxic into 80s% on RA. Now satting 92% on 3L [place]. No CP, SOB, cough, leg swelling. Does have h/o asthma but doesn't report wheezing and no wheezing on exam. Looks comfortable. CXR clear. Does have leukocytosis to 19.   BP 123/80 (BP Location: Left Arm)   Pulse 69   Temp 97.9 F (36.6 C) (Oral)   Resp 11   SpO2 92%   Patient is extremely well-appearing and hemodynamically stable on my evaluation.  ED Course:   Clinical Course as of 07/13/24 1702  Sat Jul 13, 2024  1519 SpO2: 92 % [HN]  1519 O2 Flow Rate (L/min): 3 L/min [HN]  1519 WBC(!): 19.7 [HN]  1637 CT Angio Chest PE W and/or Wo Contrast 1. No evidence of pulmonary embolus. 2. Interlobular septal thickening and scattered dependent ground-glass airspace disease, consistent with mild pulmonary edema. 3. Aortic Atherosclerosis (ICD10-I70.0). Coronary artery atherosclerosis.   [HN]  1637 B Natriuretic Peptide(!): 139.4 CT PE notes mild pulmonary edema and patient has a mildly elevated BNP as well.  Last echocardiogram was in 2020 which showed normal EF and normal right systolic function as well. [HN]  1654 Patient reevaluated.  She feels very well.  She is satting 98 to 100% on room air.  She had stable oxygen in the mid 90s with ambulation with the bedside nurse.  Patient feels very motivated to be discharged.  I discussed the possible diagnosis of heart failure and the need for repeat echocardiogram with a cardiologist.  Will give patient a dose of Lasix  here as well as potassium and discharged with 3 days of Lasix  and potassium supplementation.  Advised to follow-up with her cardiologist Dr. Pietro within 1 to 2 weeks.  Her potassium was 3.6 here today.  Given discharge instructions and return precautions, all  questions answered to patient and her husband satisfaction. [HN]    Clinical Course User Index [HN] Franklyn Sid SAILOR, MD    Dispo: DC ------------------------------- Sid Franklyn, MD Emergency Medicine  This note was created using dictation software, which may contain spelling or grammatical errors.   Franklyn Sid SAILOR, MD 07/13/24 [phone]

## 2024-07-13 NOTE — ED Notes (Signed)
 While ambulating the pt's pulse was 94-99 and had no complaints.

## 2024-07-13 NOTE — ED Notes (Signed)
 While pt was speaking o2 saturation decreased to 87%. Increase O2 to 3 lpm.

## 2024-07-13 NOTE — ED Notes (Signed)
 Pt O2 saturation reading low. Placed on 2lpm. Advised provider. Provider came to speak with pt about next steps.

## 2024-07-13 NOTE — Discharge Instructions (Addendum)
 Thank you for coming to [facility] Emergency Department. You were seen for emotional distress but were found to have a low oxygen saturation for period of time.. We did an exam, labs, and imaging, and these showed mild edema in the lungs and an elevated BNP.  You will need an updated echocardiogram to assess the function of your heart.  Please take Lasix  20 mg once per day for 3 days as well as a tablet of potassium supplementation for 3 days.   Please follow up with your cardiologist Dr. Pietro within 1 to 2 weeks.  Do not hesitate to return to the ED or call 911 if you experience: -Worsening symptoms -Chest pain, shortness of breath -Lightheadedness, passing out -Fevers/chills -Anything else that concerns you

## 2024-07-17 DIAGNOSIS — J45909 Unspecified asthma, uncomplicated: Secondary | ICD-10-CM | POA: Insufficient documentation

## 2024-07-17 DIAGNOSIS — M810 Age-related osteoporosis without current pathological fracture: Secondary | ICD-10-CM | POA: Insufficient documentation

## 2024-07-17 DIAGNOSIS — H269 Unspecified cataract: Secondary | ICD-10-CM | POA: Insufficient documentation

## 2024-07-17 DIAGNOSIS — I10 Essential (primary) hypertension: Secondary | ICD-10-CM | POA: Insufficient documentation

## 2024-07-17 DIAGNOSIS — C801 Malignant (primary) neoplasm, unspecified: Secondary | ICD-10-CM | POA: Insufficient documentation

## 2024-07-17 DIAGNOSIS — D649 Anemia, unspecified: Secondary | ICD-10-CM | POA: Insufficient documentation

## 2024-07-17 DIAGNOSIS — R011 Cardiac murmur, unspecified: Secondary | ICD-10-CM | POA: Insufficient documentation

## 2024-07-17 DIAGNOSIS — T7840XA Allergy, unspecified, initial encounter: Secondary | ICD-10-CM | POA: Insufficient documentation

## 2024-07-17 DIAGNOSIS — M1990 Unspecified osteoarthritis, unspecified site: Secondary | ICD-10-CM | POA: Insufficient documentation

## 2024-07-17 DIAGNOSIS — E119 Type 2 diabetes mellitus without complications: Secondary | ICD-10-CM | POA: Insufficient documentation

## 2024-07-17 DIAGNOSIS — K5790 Diverticulosis of intestine, part unspecified, without perforation or abscess without bleeding: Secondary | ICD-10-CM | POA: Insufficient documentation

## 2024-07-17 DIAGNOSIS — E785 Hyperlipidemia, unspecified: Secondary | ICD-10-CM | POA: Insufficient documentation

## 2024-07-17 DIAGNOSIS — K219 Gastro-esophageal reflux disease without esophagitis: Secondary | ICD-10-CM | POA: Insufficient documentation

## 2024-07-17 NOTE — Progress Notes (Unsigned)
 Cardiology Office Note:    Date:  07/18/2024   ID:  Debra  AZLYNN Galvan, DOB 07-27-1946, MRN [id number]  PCP:  Debra Leita DEL, MD  Cardiologist:  Debra Leiter, MD   Referring MD: Debra Leita DEL, MD  ASSESSMENT:    1. Hypertensive heart disease with heart failure (HCC)   2. Coronary artery calcification seen on CT scan   3. Agatston coronary artery calcium  score less than 100   4. Mixed hyperlipidemia   5. Type 2 diabetes mellitus with obesity (HCC)   6. Mild intermittent asthma, unspecified whether complicated   7. Aortic atherosclerosis (HCC)    PLAN:    In order of problems listed above:  I think it is clear that she had acute flash pulmonary edema responding to IV diuretic in the ED and she has had chronic heart failure preceding this presentation to the hospital I do not think she had a panic attack I think panic was due to heart failure and hypoxemia She is at risk for CAD with age coronary calcification diabetes and hypertensive heart disease and needs to quickly undergo cardiac CTA flash pulmonary edema can be a marker of very severe obstructive CAD Also echocardiogram to assess structural heart and filling pressures I renewed her loop diuretic furosemide  20 mill grams daily and continue her other medications including metoprolol  and nonstatin ezetimibe  Next appointment follow-up 3 to 4 weeks [place] Debra Galvan   Medication Adjustments/Labs and Tests Ordered: Current medicines are reviewed at length with the patient today.  Concerns regarding medicines are outlined above.  Orders Placed This Encounter  Procedures   CT CORONARY MORPH W/CTA COR W/SCORE W/CA W/CM &/OR WO/CM   ECHOCARDIOGRAM COMPLETE   Meds ordered this encounter  Medications   furosemide  (LASIX ) 20 MG tablet    Sig: Take 1 tablet (20 mg total) by mouth daily.    Dispense:  90 tablet    Refill:  3   metoprolol  tartrate (LOPRESSOR ) 50 MG tablet    Sig: Take 1 tablet (50 mg total) by mouth once for 1 dose.  Please take this medication 2 hours before CT.    Dispense:  1 tablet    Refill:  0     No chief complaint on file. Complaint I had a panic attack and shortness of breath  History of Present Illness:    Debra  N Galvan is a 78 y.o. female who is being seen today for the evaluation of shortness of breath heart failure after recent ED visit at the request of Debra Leita DEL, MD.  She was seen Debra Galvan was a long hospital 07/14/2023 with complaints of shortness of breath and was found to be hypoxic saturation in the range of 85% initially felt to be in atrial fibrillation however reviewing EMS EKG it is sinus rhythm.  Evaluation included chest x-ray read as no acute process CT angio of the chest with no evidence of pulmonary embolism she had septal thickening felt to be consistent with pulmonary edema.  The heart was normal although she had atherosclerosis of the aorta and coronary arteries laboratory studies included a CBC with a white count of 19,700 hemoglobin 13.3 random glucose 175 potassium 3.6 sodium 140 creatinine 1.07 GFR 53 cc/min proBNP level was modestly elevated 139 not diagnostic of heart failure she was treated with albuterol  received IV Lasix  potassium was improved and discharged from the ED.  The final ED notes show that her saturations were 92% on 3 L of nasal oxygen subsequent improving  to the mid 90s saturation without oxygen he has felt to be improved stable and appropriate for discharge from the hospital with further cardiac evaluation for heart failure.  She was seen at by my cardiology partner Dr. Pietro Galvan 2021 with a history of coronary calcification elevated coronary calcium  score hypertension and hyper lipidemia.  Her total coronary calcium  score was 3 31st percentile for age gender and ethnicity echocardiogram at that time showed normal left ventricular size wall thickness systolic function EF 60 to 65% and grade 1 diastolic dysfunction the left atrium was mildly  enlarged.  Initially her impression was that everything was due to stress and panic attack and asthma In my impression she clearly had flash pulmonary edema that quickly cleared in the emergency room with IV diuretic In retrospect she has had symptoms for months of edema and exertional shortness of breath if she walks a longer distance or an incline she has to stop and rest and she sleeps on a incline elevated head of the bed although she is not having PND Since starting a loop diuretic she was given a prescription for 3 days she feels better her weight is down 5 pounds edema has decreased and she is less short of breath I am unsure whether she had anginal discomfort she holds her hand to her chest and says that she was very short of breath but did not have severe chest pain no indication of acute coronary syndrome Her cardiovascular risk include her hypertension age and diabetes.  She does have coronary atherosclerosis Although her heart failure is well compensated I am quite concerned that this was ischemic in nature with flash pulmonary edema and I think she needs a quick evaluation including a repeat echocardiogram but also cardiac CTA as often this is a marker of very severe ischemic CAD she agrees she does not want to come back to aspirin I will refer her to my partner Debra Galvan for follow-up  She will continue her loop diuretic  Past Medical History:  Diagnosis Date   Allergy    Anemia    Arthritis    shoulder   Asthma    Cancer (HCC)    skin cancer   Cataract    small forming    Colitis 03/08/2021   Diabetes mellitus (HCC)    Diverticulosis    Essential hypertension 03/08/2021   GERD (gastroesophageal reflux disease)    Gout 07/13/2024   Heart murmur    Hyperlipidemia    Hypertension    Leukocytosis 03/08/2021   Mild intermittent asthma 12/25/2019   Mixed hyperlipidemia 12/26/2019   Osteoporosis    Subclinical hypothyroidism 07/13/2024   Type 2 diabetes mellitus with  diabetic chronic kidney disease (HCC) 12/25/2019   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   IMO SNOMED Dx Update Oct 2024      Past Surgical History:  Procedure Laterality Date   ABDOMINAL HYSTERECTOMY     APPENDECTOMY     COLONOSCOPY     POLYPECTOMY  2005   TA polyp    Current Medications: Current Meds  Medication Sig   acetaminophen  (TYLENOL ) 500 MG tablet Take 500 mg by mouth every 6 (six) hours as needed (for pain).   albuterol  (VENTOLIN  HFA) 108 (90 Base) MCG/ACT inhaler Inhale 1 puff into the lungs every 6 (six) hours as needed for wheezing or shortness of breath.   atenolol -chlorthalidone  (TENORETIC ) 100-25 MG per tablet Take 1 tablet by mouth daily.   calcium -vitamin D (OSCAL WITH D)  250-125 MG-UNIT tablet Take 1 tablet by mouth 2 (two) times a week.   [employer] Lancets Micro Thin 33G MISC daily. for testing   ergocalciferol (VITAMIN D2) 1.25 MG (50000 UT) capsule Take 50,000 Units by mouth every Saturday.   ezetimibe (ZETIA) 10 MG tablet Take 10 mg by mouth daily.   furosemide  (LASIX ) 20 MG tablet Take 1 tablet (20 mg total) by mouth daily.   losartan  (COZAAR ) 100 MG tablet Take 100 mg by mouth daily.   metoprolol  tartrate (LOPRESSOR ) 50 MG tablet Take 1 tablet (50 mg total) by mouth once for 1 dose. Please take this medication 2 hours before CT.   naproxen sodium (ALEVE) 220 MG tablet Take 220 mg by mouth 2 (two) times daily as needed (for pain).   TRUETRACK TEST test strip CHECK DAILY BLOOD SUGAR     Allergies:   Atorvastatin, Prednisone, and Sulfa antibiotics   Social History   Socioeconomic History   Marital status: Married    Spouse name: Not on file   Number of children: 3   Years of education: Not on file   Highest education level: Not on file  Occupational History   Not on file  Tobacco Use   Smoking status: Never   Smokeless tobacco: Never  Substance and Sexual Activity   Alcohol use: Yes    Alcohol/week: 0.0 standard drinks of alcohol     Comment: 1 glass a month - rare    Drug use: Never   Sexual activity: Not on file  Other Topics Concern   Not on file  Social History Narrative   Not on file   Social Drivers of Health   Financial Resource Strain: Not on file  Food Insecurity: Not on file  Transportation Needs: Not on file  Physical Activity: Not on file  Stress: Not on file  Social Connections: Not on file     Family History: The patient's family history includes Colon cancer in her maternal grandfather; Heart disease in her father; Hypertension in her father; Stroke in her father and mother. There is no history of Breast cancer, Colon polyps, Esophageal cancer, Stomach cancer, Rectal cancer, or BRCA 1/2.  ROS:   ROS Please see the history of present illness.     All other systems reviewed and are negative.  EKGs/Labs/Other Studies Reviewed:    The following studies were reviewed today:     Physical Exam:    VS:  BP 130/78   Pulse 64   Ht 5' 4 (1.626 m)   Wt 169 lb 3.2 oz (76.7 kg)   SpO2 96%   BMI 29.04 kg/m     Wt Readings from Last 3 Encounters:  07/18/24 169 lb 3.2 oz (76.7 kg)  02/04/23 180 lb (81.6 kg)  04/22/21 179 lb 6 oz (81.4 kg)     GEN: She appears her age is does not look chronically ill well nourished, well developed in no acute distress HEENT: Normal NECK: No JVD; No carotid bruits LYMPHATICS: No lymphadenopathy CARDIAC: RRR, no murmurs, rubs, gallops RESPIRATORY:  Clear to auscultation without rales, wheezing or rhonchi  ABDOMEN: Soft, non-tender, non-distended MUSCULOSKELETAL: Bilateral ankle to knee 2+ pitting edema edema; No deformity  SKIN: Warm and dry NEUROLOGIC:  Alert and oriented x 3 PSYCHIATRIC:  Normal affect     Signed, Debra Leiter, MD  07/18/2024 11:20 AM    [facility]

## 2024-07-18 ENCOUNTER — Encounter: Payer: Self-pay | Admitting: Cardiology

## 2024-07-18 ENCOUNTER — Ambulatory Visit: Attending: Cardiology | Admitting: Cardiology

## 2024-07-18 VITALS — BP 130/78 | HR 64 | Ht 64.0 in | Wt 169.2 lb

## 2024-07-18 DIAGNOSIS — R931 Abnormal findings on diagnostic imaging of heart and coronary circulation: Secondary | ICD-10-CM | POA: Diagnosis not present

## 2024-07-18 DIAGNOSIS — I110 Hypertensive heart disease with heart failure: Secondary | ICD-10-CM | POA: Diagnosis not present

## 2024-07-18 DIAGNOSIS — I2510 Atherosclerotic heart disease of native coronary artery without angina pectoris: Secondary | ICD-10-CM

## 2024-07-18 DIAGNOSIS — E782 Mixed hyperlipidemia: Secondary | ICD-10-CM | POA: Diagnosis not present

## 2024-07-18 DIAGNOSIS — I700 Atherosclerosis of aorta: Secondary | ICD-10-CM

## 2024-07-18 DIAGNOSIS — J4520 Mild intermittent asthma, uncomplicated: Secondary | ICD-10-CM

## 2024-07-18 DIAGNOSIS — E669 Obesity, unspecified: Secondary | ICD-10-CM

## 2024-07-18 DIAGNOSIS — E1169 Type 2 diabetes mellitus with other specified complication: Secondary | ICD-10-CM

## 2024-07-18 MED ORDER — FUROSEMIDE 20 MG PO TABS: 20.0000 mg | ORAL_TABLET | Freq: Every day | ORAL | 3 refills | Status: AC

## 2024-07-18 MED ORDER — METOPROLOL TARTRATE 50 MG PO TABS
50.0000 mg | ORAL_TABLET | Freq: Once | ORAL | 0 refills | Status: DC
Start: 2024-07-18 — End: 2024-07-21

## 2024-07-18 NOTE — Patient Instructions (Signed)
 Medication Instructions:  Your physician has recommended you make the following change in your medication:   START: Furosemide  20 mg daily  *If you need a refill on your cardiac medications before your next appointment, please call your pharmacy*  Lab Work: Your physician recommends that you return for lab work in:   Labs today: BMP  If you have labs (blood work) drawn today and your tests are completely normal, you will receive your results only by: MyChart Message (if you have MyChart) OR A paper copy in the mail If you have any lab test that is abnormal or we need to change your treatment, we will call you to review the results.  Testing/Procedures: Your physician has requested that you have an echocardiogram. Echocardiography is a painless test that uses sound waves to create images of your heart. It provides your doctor with information about the size and shape of your heart and how well your heart's chambers and valves are working. This procedure takes approximately one hour. There are no restrictions for this procedure. Please do NOT wear cologne, perfume, aftershave, or lotions (deodorant is allowed). Please arrive 15 minutes prior to your appointment time.  Please note: We ask at that you not bring children with you during ultrasound (echo/ vascular) testing. Due to room size and safety concerns, children are not allowed in the ultrasound rooms during exams. Our front office staff cannot provide observation of children in our lobby area while testing is being conducted. An adult accompanying a patient to their appointment will only be allowed in the ultrasound room at the discretion of the ultrasound technician under special circumstances. We apologize for any inconvenience.    Your cardiac CT will be scheduled at one of the below locations:   [facility] [address] [place], [place] [address] [phone]  OR   [facility] [address] [place], [place] [address] [phone]  OR   [facility] [address] [place], [place] [address] [phone]  OR   [facility] and Vascular Tower [address]  [place], [place] [address]  OR   [facility] [address] [place], [place]  If scheduled at [facility], please arrive at the [facility] and Children's Entrance (Entrance C2) of [facility] 30 minutes prior to test start time. You can use the FREE valet parking offered at entrance C (encouraged to control the heart rate for the test)  Proceed to the [facility] Radiology Department (first floor) to check-in and test prep.  All radiology patients and guests should use entrance C2 at [facility], accessed from [facility], even though the hospital's physical address listed is [address]  If scheduled at the Heart and Vascular Tower at [address], please enter the parking lot using the [address] entrance and use the FREE valet service at the patient drop-off area. Enter the building and check-in with registration on the main floor.  If scheduled at [facility], please arrive to the Heart and Vascular Center 15 mins early for check-in and test prep.  There is spacious parking and easy access to the radiology department from the [facility] Heart and Vascular entrance. Please enter here and check-in with the desk attendant.   If scheduled at [facility], please arrive 30 minutes early for check-in and test prep.  Please follow these instructions carefully (unless otherwise directed):  On the Night Before  the Test: Be sure to Drink plenty of water. Do not consume any caffeinated/decaffeinated beverages or chocolate 12 hours prior to your test. Do not take any antihistamines 12 hours prior to your test.  On the Day of the Test: Drink plenty of water until 1 hour prior to the test. Do not eat any  food 1 hour prior to test. You may take your regular medications prior to the test.  Take metoprolol  (Lopressor ) two hours prior to test. If you take Furosemide  please HOLD on the morning of the test. Patients who wear a continuous glucose monitor MUST remove the device prior to scanning. FEMALES- please wear underwire-free bra if available, avoid dresses & tight clothing      After the Test: Drink plenty of water. After receiving IV contrast, you may experience a mild flushed feeling. This is normal. On occasion, you may experience a mild rash up to 24 hours after the test. This is not dangerous. If this occurs, you can take Benadryl 25 mg, Zyrtec, Claritin, or Allegra and increase your fluid intake. (Patients taking Tikosyn should avoid Benadryl, and may take Zyrtec, Claritin, or Allegra) If you experience trouble breathing, this can be serious. If it is severe call 911 IMMEDIATELY. If it is mild, please call our office.  We will call to schedule your test 2-4 weeks out understanding that some insurance companies will need an authorization prior to the service being performed.   For more information and frequently asked questions, please visit our website : [email or web address]  For non-scheduling related questions, please contact the cardiac imaging nurse navigator should you have any questions/concerns: Cardiac Imaging Nurse Navigators Direct Office Dial: [phone]   For scheduling needs, including cancellations and rescheduling, please call [place], [phone].   Follow-Up: At [facility], you and your health needs are our priority.  As part of our continuing mission to provide you with exceptional heart care, our providers are all part of one team.  This team includes your primary Cardiologist (physician) and Advanced Practice Providers or APPs (Physician Assistants and Nurse Practitioners) who all work together to provide you with the care you need, when  you need it.  Your next appointment:   3 week(s)  Provider:   Dr. Sheena   We recommend signing up for the patient portal called MyChart.  Sign up information is provided on this After Visit Summary.  MyChart is used to connect with patients for Virtual Visits (Telemedicine).  Patients are able to view lab/test results, encounter notes, upcoming appointments, etc.  Non-urgent messages can be sent to your provider as well.   To learn more about what you can do with MyChart, go to [email or web address].   Other Instructions None

## 2024-07-19 ENCOUNTER — Encounter (HOSPITAL_COMMUNITY): Payer: Self-pay

## 2024-07-20 ENCOUNTER — Ambulatory Visit (HOSPITAL_COMMUNITY)
Admission: EM | Admit: 2024-07-20 | Discharge: 2024-07-20 | Disposition: A | Discharge status: Home / Self Care | Attending: Internal Medicine | Admitting: Internal Medicine

## 2024-07-20 ENCOUNTER — Encounter (HOSPITAL_COMMUNITY): Payer: Self-pay

## 2024-07-20 DIAGNOSIS — R509 Fever, unspecified: Secondary | ICD-10-CM | POA: Diagnosis not present

## 2024-07-20 DIAGNOSIS — U071 COVID-19: Secondary | ICD-10-CM

## 2024-07-20 LAB — POC COVID19/FLU A&B COMBO
Covid Antigen, POC: POSITIVE — AB
Influenza A Antigen, POC: NEGATIVE
Influenza B Antigen, POC: NEGATIVE

## 2024-07-20 MED ORDER — PAXLOVID (150/100) 10 X 150 MG & 10 X 100MG PO TBPK: 2.0000 | ORAL_TABLET | Freq: Two times a day (BID) | ORAL | 0 refills | Status: AC

## 2024-07-20 NOTE — ED Triage Notes (Signed)
 Patient presents with cough and congestion that started yesterday. Patient states she woke up with a fever today 100.6.  Patient states she was seen in the ED last week for Hypoxia.

## 2024-07-20 NOTE — ED Provider Notes (Signed)
 [facility]    CSN: [id number] Arrival date & time: 07/20/24  1428      History   Chief Complaint Chief Complaint  Patient presents with   Fever   Cough   Nasal Congestion    HPI Debra Galvan is a 78 y.o. female.   78 year old female presents urgent care with complaints of cough, congestion and fever.  She reports that yesterday she developed cough and congestion.  This morning she woke up with a fever of 100.6.  She denies any shortness of breath, nausea, vomiting, chest pain.  Overall she does not feel well.  She does have a coronary CT scheduled for Wednesday.  She was in the emergency room on August 2 for hypoxia. Covid was negative at that time. She does have a slight headache as well.   Fever Associated symptoms: cough and headaches   Associated symptoms: no chest pain, no chills, no dysuria, no ear pain, no rash, no sore throat and no vomiting   Cough Associated symptoms: fever and headaches   Associated symptoms: no chest pain, no chills, no ear pain, no rash, no shortness of breath and no sore throat     Past Medical History:  Diagnosis Date   Allergy    Anemia    Arthritis    shoulder   Asthma    Cancer (HCC)    skin cancer   Cataract    small forming    Colitis 03/08/2021   Diabetes mellitus (HCC)    Diverticulosis    Essential hypertension 03/08/2021   GERD (gastroesophageal reflux disease)    Gout 07/13/2024   Heart murmur    Hyperlipidemia    Hypertension    Leukocytosis 03/08/2021   Mild intermittent asthma 12/25/2019   Mixed hyperlipidemia 12/26/2019   Osteoporosis    Subclinical hypothyroidism 07/13/2024   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   IMO SNOMED Dx Update Oct 2024      Patient Active Problem List   Diagnosis Date Noted   Allergy    Anemia    Arthritis    Asthma    Cancer (HCC)    Cataract    Diabetes mellitus  (HCC)    Diverticulosis    GERD (gastroesophageal reflux disease)    Heart murmur    Hyperlipidemia    Hypertension    Osteoporosis    Gout 07/13/2024   Subclinical hypothyroidism 07/13/2024   Colitis 03/08/2021   Leukocytosis 03/08/2021   Essential hypertension 03/08/2021   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      Home Medications    Prior to Admission medications   Medication Sig Start Date End Date Taking? Authorizing Provider  acetaminophen  (TYLENOL ) 500 MG tablet Take 500 mg by mouth every 6 (six) hours as needed (for pain).   Yes [provider]  albuterol  (VENTOLIN  HFA) 108 (90 Base) MCG/ACT inhaler Inhale 1 puff into the lungs every 6 (six) hours as needed for wheezing or shortness of  breath. 03/10/21  Yes [place], Camellia PARAS, DO  atenolol -chlorthalidone  (TENORETIC ) 100-25 MG per tablet Take 1 tablet by mouth daily.   Yes [provider]  calcium -vitamin D (OSCAL WITH D) 250-125 MG-UNIT tablet Take 1 tablet by mouth 2 (two) times a week.   Yes [provider]  [employer] Lancets Micro Thin 33G MISC daily. for testing 07/29/19  Yes [provider]  ergocalciferol (VITAMIN D2) 1.25 MG (50000 UT) capsule Take 50,000 Units by mouth every Saturday.   Yes [provider]  ezetimibe  (ZETIA ) 10 MG tablet Take 10 mg by mouth daily.   Yes [provider]  furosemide  (LASIX ) 20 MG tablet Take 1 tablet (20 mg total) by mouth daily. 07/18/24  Yes Monetta Redell PARAS, MD  losartan  (COZAAR ) 100 MG tablet Take 100 mg by mouth daily.   Yes [provider]  naproxen sodium (ALEVE)  220 MG tablet Take 220 mg by mouth 2 (two) times daily as needed (for pain).   Yes [provider]  nirmatrelvir /ritonavir , renal dosing, (PAXLOVID , 150/100,) 10 x 150 MG & 10 x 100MG  TBPK Take 2 tablets by mouth 2 (two) times daily for 5 days. Dosage for moderate renal impairment (eGFR >/= 30 to <60 mL/min): 150 mg nirmatrelvir  (one 150 mg tablet) with 100 mg ritonavir  (one 100 mg tablet), with both tablets taken together twice daily for 5 days. Not recommended if eGFR < 30 mL/min. PAXLOVID  is not recommend in patients with severe hepatic impairment (Child-Pugh Class C). 07/20/24 07/25/24 Yes Shriyans Kuenzi A, PA-C  TRUETRACK TEST test strip CHECK DAILY BLOOD SUGAR 07/29/19  Yes [provider]  metoprolol  tartrate (LOPRESSOR ) 50 MG tablet Take 1 tablet (50 mg total) by mouth once for 1 dose. Please take this medication 2 hours before CT. 07/18/24 07/18/24  Monetta Redell PARAS, MD    Family History Family History  Problem Relation Age of Onset   Stroke Mother    Stroke Father    Heart disease Father    Hypertension Father    Colon cancer Maternal Grandfather    Breast cancer Neg Hx    Colon polyps Neg Hx    Esophageal cancer Neg Hx    Stomach cancer Neg Hx    Rectal cancer Neg Hx    BRCA 1/2 Neg Hx     Social History Social History   Tobacco Use   Smoking status: Never   Smokeless tobacco: Never  Substance Use Topics   Alcohol use: Yes    Alcohol/week: 0.0 standard drinks of alcohol    Comment: 1 glass a month - rare    Drug use: Never     Allergies   Atorvastatin, Prednisone, and Sulfa antibiotics   Review of Systems Review of Systems  Constitutional:  Positive for fever. Negative for chills.  HENT:  Negative for ear pain and sore throat.   Eyes:  Negative for pain and visual disturbance.  Respiratory:  Positive for cough. Negative for shortness of breath.   Cardiovascular:  Negative for chest pain and palpitations.  Gastrointestinal:  Negative for abdominal  pain and vomiting.  Genitourinary:  Negative for dysuria and hematuria.  Musculoskeletal:  Negative for arthralgias and back pain.  Skin:  Negative for color change and rash.  Neurological:  Positive for headaches. Negative for seizures and syncope.  All other systems reviewed and are negative.    Physical Exam Triage Vital Signs ED Triage Vitals  Encounter Vitals Group     BP 07/20/24 1555 118/61  Girls Systolic BP Percentile --      Girls Diastolic BP Percentile --      Boys Systolic BP Percentile --      Boys Diastolic BP Percentile --      Pulse Rate 07/20/24 1555 70     Resp 07/20/24 1555 18     Temp 07/20/24 1555 99.9 F (37.7 C)     Temp Source 07/20/24 1555 Oral     SpO2 07/20/24 1556 94 %     Weight --      Height --      Head Circumference --      Peak Flow --      Pain Score 07/20/24 1602 0     Pain Loc --      Pain Education --      Exclude from Growth Chart --    No data found.  Updated Vital Signs BP 118/61 (BP Location: Left Arm)   Pulse 70   Temp 99.9 F (37.7 C) (Oral)   Resp 18   SpO2 94%   Visual Acuity Right Eye Distance:   Left Eye Distance:   Bilateral Distance:    Right Eye Near:   Left Eye Near:    Bilateral Near:     Physical Exam Vitals and nursing note reviewed.  Constitutional:      General: She is not in acute distress.    Appearance: She is well-developed.  HENT:     Head: Normocephalic and atraumatic.     Right Ear: Tympanic membrane normal.     Left Ear: Tympanic membrane normal.     Nose: Congestion present.     Mouth/Throat:     Mouth: Mucous membranes are moist.  Eyes:     Conjunctiva/sclera: Conjunctivae normal.  Cardiovascular:     Rate and Rhythm: Normal rate and regular rhythm.     Heart sounds: No murmur heard. Pulmonary:     Effort: Pulmonary effort is normal. No respiratory distress.     Breath sounds: Normal breath sounds.  Abdominal:     Palpations: Abdomen is soft.     Tenderness: There is no  abdominal tenderness.  Musculoskeletal:        General: No swelling.     Cervical back: Neck supple.  Skin:    General: Skin is warm and dry.     Capillary Refill: Capillary refill takes less than 2 seconds.  Neurological:     General: No focal deficit present.     Mental Status: She is alert.  Psychiatric:        Mood and Affect: Mood normal.      UC Treatments / Results  Labs (all labs ordered are listed, but only abnormal results are displayed) Labs Reviewed  POC COVID19/FLU A&B COMBO - Abnormal; Notable for the following components:      Result Value   Covid Antigen, POC Positive (*)    All other components within normal limits    EKG   Radiology No results found.  Procedures Procedures (including critical care time)  Medications Ordered in UC Medications - No data to display  Initial Impression / Assessment and Plan / UC Course  I have reviewed the triage vital signs and the nursing notes.  Pertinent labs & imaging results that were available during my care of the patient were reviewed by me and considered in my medical decision making (see chart for details).     Fever, unspecified - Plan: POC Covid19/Flu A&B Antigen, POC  Covid19/Flu A&B Antigen  COVID-19   GFR 53  Flu A, flu B and COVID testing done today COVID is positive.  The physical exam findings and vitals are reassuring however there is fever present.  Due to the risk we will treat with Paxlovid .  This medication helps to reduce the severity of COVID.  Recommend contacting your cardiologist to let them know that you are positive for COVID given the CT that is scheduled for Wednesday.  Monitor your symptoms, if you feel that things are worsening return to urgent care or go to the emergency room.  Rest and stay hydrated.  Final Clinical Impressions(s) / UC Diagnoses   Final diagnoses:  Fever, unspecified  COVID-19     Discharge Instructions      Flu A, flu B and COVID testing done today  COVID is positive.  The physical exam findings and vitals are reassuring however there is fever present.  Due to the risk we will treat with Paxlovid .  This medication helps to reduce the severity of COVID.  Recommend contacting your cardiologist to let them know that you are positive for COVID given the CT that is scheduled for Wednesday.  Monitor your symptoms, if you feel that things are worsening return to urgent care or go to the emergency room.  Rest and stay hydrated.    ED Prescriptions     Medication Sig Dispense Auth. Provider   nirmatrelvir /ritonavir , renal dosing, (PAXLOVID , 150/100,) 10 x 150 MG & 10 x 100MG  TBPK Take 2 tablets by mouth 2 (two) times daily for 5 days. Dosage for moderate renal impairment (eGFR >/= 30 to <60 mL/min): 150 mg nirmatrelvir  (one 150 mg tablet) with 100 mg ritonavir  (one 100 mg tablet), with both tablets taken together twice daily for 5 days. Not recommended if eGFR < 30 mL/min. PAXLOVID  is not recommend in patients with severe hepatic impairment (Child-Pugh Class C). 20 tablet Teresa Almarie LABOR, [place]      PDMP not reviewed this encounter.   Teresa Almarie LABOR, [place] 07/20/24 1637

## 2024-07-20 NOTE — Discharge Instructions (Signed)
 Flu A, flu B and COVID testing done today COVID is positive.  The physical exam findings and vitals are reassuring however there is fever present.  Due to the risk we will treat with Paxlovid .  This medication helps to reduce the severity of COVID.  Recommend contacting your cardiologist to let them know that you are positive for COVID given the CT that is scheduled for Wednesday.  Monitor your symptoms, if you feel that things are worsening return to urgent care or go to the emergency room.  Rest and stay hydrated.

## 2024-07-20 NOTE — ED Triage Notes (Incomplete)
 Also, patient states she feel SOB wearing the mask.

## 2024-07-21 ENCOUNTER — Emergency Department (HOSPITAL_COMMUNITY)

## 2024-07-21 ENCOUNTER — Observation Stay (HOSPITAL_COMMUNITY)
Admission: EM | Admit: 2024-07-21 | Discharge: 2024-07-22 | Disposition: A | Discharge status: Home / Self Care | Attending: Internal Medicine | Admitting: Internal Medicine

## 2024-07-21 ENCOUNTER — Encounter (HOSPITAL_COMMUNITY): Payer: Self-pay | Admitting: Internal Medicine

## 2024-07-21 ENCOUNTER — Other Ambulatory Visit: Payer: Self-pay

## 2024-07-21 DIAGNOSIS — E038 Other specified hypothyroidism: Secondary | ICD-10-CM | POA: Diagnosis present

## 2024-07-21 DIAGNOSIS — J811 Chronic pulmonary edema: Secondary | ICD-10-CM | POA: Diagnosis present

## 2024-07-21 DIAGNOSIS — R0603 Acute respiratory distress: Secondary | ICD-10-CM | POA: Diagnosis present

## 2024-07-21 DIAGNOSIS — E02 Subclinical iodine-deficiency hypothyroidism: Secondary | ICD-10-CM | POA: Insufficient documentation

## 2024-07-21 DIAGNOSIS — N189 Chronic kidney disease, unspecified: Secondary | ICD-10-CM | POA: Insufficient documentation

## 2024-07-21 DIAGNOSIS — J45901 Unspecified asthma with (acute) exacerbation: Secondary | ICD-10-CM | POA: Diagnosis present

## 2024-07-21 DIAGNOSIS — K219 Gastro-esophageal reflux disease without esophagitis: Secondary | ICD-10-CM | POA: Diagnosis present

## 2024-07-21 DIAGNOSIS — I1310 Hypertensive heart and chronic kidney disease without heart failure, with stage 1 through stage 4 chronic kidney disease, or unspecified chronic kidney disease: Secondary | ICD-10-CM | POA: Insufficient documentation

## 2024-07-21 DIAGNOSIS — T7840XA Allergy, unspecified, initial encounter: Secondary | ICD-10-CM | POA: Diagnosis not present

## 2024-07-21 DIAGNOSIS — E1122 Type 2 diabetes mellitus with diabetic chronic kidney disease: Secondary | ICD-10-CM | POA: Diagnosis not present

## 2024-07-21 DIAGNOSIS — E785 Hyperlipidemia, unspecified: Secondary | ICD-10-CM | POA: Diagnosis present

## 2024-07-21 DIAGNOSIS — Z1152 Encounter for screening for COVID-19: Secondary | ICD-10-CM | POA: Insufficient documentation

## 2024-07-21 DIAGNOSIS — U071 COVID-19: Secondary | ICD-10-CM | POA: Diagnosis present

## 2024-07-21 DIAGNOSIS — E876 Hypokalemia: Secondary | ICD-10-CM | POA: Diagnosis present

## 2024-07-21 DIAGNOSIS — F1090 Alcohol use, unspecified, uncomplicated: Secondary | ICD-10-CM | POA: Diagnosis not present

## 2024-07-21 DIAGNOSIS — J810 Acute pulmonary edema: Secondary | ICD-10-CM | POA: Diagnosis not present

## 2024-07-21 DIAGNOSIS — R7989 Other specified abnormal findings of blood chemistry: Secondary | ICD-10-CM | POA: Diagnosis present

## 2024-07-21 DIAGNOSIS — J9601 Acute respiratory failure with hypoxia: Secondary | ICD-10-CM | POA: Diagnosis not present

## 2024-07-21 DIAGNOSIS — I517 Cardiomegaly: Secondary | ICD-10-CM | POA: Diagnosis present

## 2024-07-21 DIAGNOSIS — I10 Essential (primary) hypertension: Secondary | ICD-10-CM | POA: Diagnosis present

## 2024-07-21 LAB — COMPREHENSIVE METABOLIC PANEL WITH GFR
ALT: 23 U/L (ref 0–44)
AST: 33 U/L (ref 15–41)
Albumin: 3.6 g/dL (ref 3.5–5.0)
Alkaline Phosphatase: 58 U/L (ref 38–126)
Anion gap: 17 — ABNORMAL HIGH (ref 5–15)
BUN: 33 mg/dL — ABNORMAL HIGH (ref 8–23)
CO2: 27 mmol/L (ref 22–32)
Calcium: 9.2 mg/dL (ref 8.9–10.3)
Chloride: 91 mmol/L — ABNORMAL LOW (ref 98–111)
Creatinine, Ser: 1.47 mg/dL — ABNORMAL HIGH (ref 0.44–1.00)
GFR, Estimated: 36 mL/min — ABNORMAL LOW (ref >60)
Glucose, Bld: 266 mg/dL — ABNORMAL HIGH (ref 70–99)
Potassium: 3.1 mmol/L — ABNORMAL LOW (ref 3.5–5.1)
Sodium: 135 mmol/L (ref 135–145)
Total Bilirubin: 1.1 mg/dL (ref 0.0–1.2)
Total Protein: 8 g/dL (ref 6.5–8.1)

## 2024-07-21 LAB — BLOOD GAS, VENOUS
Acid-Base Excess: 3.6 mmol/L — ABNORMAL HIGH (ref 0.0–2.0)
Bicarbonate: 31.7 mmol/L — ABNORMAL HIGH (ref 20.0–28.0)
O2 Saturation: 48.9 %
Patient temperature: 37
pCO2, Ven: 63 mmHg — ABNORMAL HIGH (ref 44–60)
pH, Ven: 7.31 (ref 7.25–7.43)
pO2, Ven: <31 mmHg — CL (ref 32–45)

## 2024-07-21 LAB — CBC WITH DIFFERENTIAL/PLATELET
Abs Immature Granulocytes: 0.14 K/uL — ABNORMAL HIGH (ref 0.00–0.07)
Basophils Absolute: 0.1 K/uL (ref 0.0–0.1)
Basophils Relative: 0 %
Eosinophils Absolute: 0 K/uL (ref 0.0–0.5)
Eosinophils Relative: 0 %
HCT: 41.7 % (ref 36.0–46.0)
Hemoglobin: 13.6 g/dL (ref 12.0–15.0)
Immature Granulocytes: 1 %
Lymphocytes Relative: 17 %
Lymphs Abs: 3.8 K/uL (ref 0.7–4.0)
MCH: 30.4 pg (ref 26.0–34.0)
MCHC: 32.6 g/dL (ref 30.0–36.0)
MCV: 93.1 fL (ref 80.0–100.0)
Monocytes Absolute: 1.7 K/uL — ABNORMAL HIGH (ref 0.1–1.0)
Monocytes Relative: 8 %
Neutro Abs: 16.5 K/uL — ABNORMAL HIGH (ref 1.7–7.7)
Neutrophils Relative %: 74 %
Platelets: 311 K/uL (ref 150–400)
RBC: 4.48 MIL/uL (ref 3.87–5.11)
RDW: 12.1 % (ref 11.5–15.5)
WBC: 22.3 K/uL — ABNORMAL HIGH (ref 4.0–10.5)
nRBC: 0 % (ref 0.0–0.2)

## 2024-07-21 LAB — TROPONIN I (HIGH SENSITIVITY)
Troponin I (High Sensitivity): 65 ng/L — ABNORMAL HIGH (ref <18)
Troponin I (High Sensitivity): 233 ng/L (ref <18)
Troponin I (High Sensitivity): 228 ng/L (ref <18)

## 2024-07-21 LAB — BRAIN NATRIURETIC PEPTIDE: B Natriuretic Peptide: 202.6 pg/mL — ABNORMAL HIGH (ref 0.0–100.0)

## 2024-07-21 LAB — GLUCOSE, CAPILLARY
Glucose-Capillary: 224 mg/dL — ABNORMAL HIGH (ref 70–99)
Glucose-Capillary: 310 mg/dL — ABNORMAL HIGH (ref 70–99)

## 2024-07-21 LAB — PHOSPHORUS: Phosphorus: 3.9 mg/dL (ref 2.5–4.6)

## 2024-07-21 LAB — MAGNESIUM: Magnesium: 2.6 mg/dL — ABNORMAL HIGH (ref 1.7–2.4)

## 2024-07-21 MED ORDER — ALBUTEROL SULFATE (2.5 MG/3ML) 0.083% IN NEBU: 2.5000 mg | INHALATION_SOLUTION | RESPIRATORY_TRACT | Status: DC | PRN

## 2024-07-21 MED ORDER — POTASSIUM CHLORIDE CRYS ER 20 MEQ PO TBCR
20.0000 meq | EXTENDED_RELEASE_TABLET | Freq: Every day | ORAL | Status: DC
  Administered 2024-07-22 (×2): 20 meq via ORAL
  Filled 2024-07-21: qty 1

## 2024-07-21 MED ORDER — INSULIN ASPART 100 UNIT/ML IJ SOLN
0.0000 [IU] | Freq: Three times a day (TID) | INTRAMUSCULAR | Status: DC
  Administered 2024-07-21 – 2024-07-22 (×2): 3 [IU] via SUBCUTANEOUS
  Administered 2024-07-22: 5 [IU] via SUBCUTANEOUS
  Administered 2024-07-22: 3 [IU] via SUBCUTANEOUS
  Administered 2024-07-22: 5 [IU] via SUBCUTANEOUS

## 2024-07-21 MED ORDER — METHYLPREDNISOLONE SODIUM SUCC 125 MG IJ SOLR
125.0000 mg | Freq: Once | INTRAMUSCULAR | Status: AC
  Administered 2024-07-21: 125 mg via INTRAVENOUS
  Filled 2024-07-21: qty 2

## 2024-07-21 MED ORDER — NIRMATRELVIR&RITONAVIR 150/100 10 X 150 MG & 10 X 100MG PO TBPK
2.0000 | ORAL_TABLET | Freq: Two times a day (BID) | ORAL | Status: DC
  Administered 2024-07-21 – 2024-07-22 (×3): 2 via ORAL
  Filled 2024-07-21 (×2): qty 2

## 2024-07-21 MED ORDER — METHYLPREDNISOLONE SODIUM SUCC 40 MG IJ SOLR
40.0000 mg | Freq: Every day | INTRAMUSCULAR | Status: DC
  Administered 2024-07-22 (×2): 40 mg via INTRAVENOUS
  Filled 2024-07-21: qty 1

## 2024-07-21 MED ORDER — ENOXAPARIN SODIUM 40 MG/0.4ML IJ SOSY
40.0000 mg | PREFILLED_SYRINGE | Freq: Every day | INTRAMUSCULAR | Status: DC
  Administered 2024-07-21 – 2024-07-22 (×3): 40 mg via SUBCUTANEOUS
  Filled 2024-07-21 (×2): qty 0.4

## 2024-07-21 MED ORDER — CHLORTHALIDONE 25 MG PO TABS
12.5000 mg | ORAL_TABLET | Freq: Every day | ORAL | Status: DC
  Administered 2024-07-22 (×2): 12.5 mg via ORAL
  Filled 2024-07-21: qty 0.5

## 2024-07-21 MED ORDER — ALBUTEROL SULFATE (2.5 MG/3ML) 0.083% IN NEBU
10.0000 mg | INHALATION_SOLUTION | Freq: Once | RESPIRATORY_TRACT | Status: AC
  Administered 2024-07-21: 10 mg via RESPIRATORY_TRACT
  Filled 2024-07-21: qty 12

## 2024-07-21 MED ORDER — ONDANSETRON HCL 4 MG PO TABS: 4.0000 mg | ORAL_TABLET | Freq: Four times a day (QID) | ORAL | Status: DC | PRN

## 2024-07-21 MED ORDER — ALBUTEROL SULFATE (2.5 MG/3ML) 0.083% IN NEBU: 10.0000 mg | INHALATION_SOLUTION | Freq: Once | RESPIRATORY_TRACT | Status: DC

## 2024-07-21 MED ORDER — MAGNESIUM SULFATE 2 GM/50ML IV SOLN: 2.0000 g | Freq: Once | INTRAVENOUS | Status: DC

## 2024-07-21 MED ORDER — FUROSEMIDE 10 MG/ML IJ SOLN
20.0000 mg | Freq: Every day | INTRAMUSCULAR | Status: DC
  Administered 2024-07-22 (×2): 20 mg via INTRAVENOUS
  Filled 2024-07-21 (×3): qty 2

## 2024-07-21 MED ORDER — ATENOLOL 50 MG PO TABS
50.0000 mg | ORAL_TABLET | Freq: Every day | ORAL | Status: DC
  Administered 2024-07-22 (×2): 50 mg via ORAL
  Filled 2024-07-21: qty 1

## 2024-07-21 MED ORDER — ONDANSETRON HCL 4 MG/2ML IJ SOLN: 4.0000 mg | Freq: Four times a day (QID) | INTRAMUSCULAR | Status: DC | PRN

## 2024-07-21 MED ORDER — POTASSIUM CHLORIDE CRYS ER 20 MEQ PO TBCR
40.0000 meq | EXTENDED_RELEASE_TABLET | Freq: Four times a day (QID) | ORAL | Status: AC
Start: 2024-07-21 — End: 2024-07-21
  Administered 2024-07-21 (×2): 40 meq via ORAL
  Filled 2024-07-21 (×2): qty 2

## 2024-07-21 MED ORDER — LOSARTAN POTASSIUM 50 MG PO TABS
100.0000 mg | ORAL_TABLET | Freq: Every day | ORAL | Status: DC
  Administered 2024-07-22 (×2): 100 mg via ORAL
  Filled 2024-07-21: qty 2

## 2024-07-21 MED ORDER — EZETIMIBE 10 MG PO TABS
10.0000 mg | ORAL_TABLET | Freq: Every day | ORAL | Status: DC
  Administered 2024-07-22 (×2): 10 mg via ORAL
  Filled 2024-07-21: qty 1

## 2024-07-21 MED ORDER — FUROSEMIDE 40 MG PO TABS: 20.0000 mg | ORAL_TABLET | Freq: Every day | ORAL | Status: DC

## 2024-07-21 MED ORDER — ACETAMINOPHEN 650 MG RE SUPP: 650.0000 mg | Freq: Four times a day (QID) | RECTAL | Status: DC | PRN

## 2024-07-21 MED ORDER — ACETAMINOPHEN 325 MG PO TABS
650.0000 mg | ORAL_TABLET | Freq: Four times a day (QID) | ORAL | Status: DC | PRN
  Filled 2024-07-21: qty 2

## 2024-07-21 MED ORDER — RACEPINEPHRINE HCL 2.25 % IN NEBU
INHALATION_SOLUTION | RESPIRATORY_TRACT | Status: AC
  Administered 2024-07-21: 15 mL
  Filled 2024-07-21: qty 15

## 2024-07-21 MED ORDER — ATENOLOL-CHLORTHALIDONE 100-25 MG PO TABS: 0.5000 | ORAL_TABLET | Freq: Every day | ORAL | Status: DC

## 2024-07-21 MED ORDER — IPRATROPIUM-ALBUTEROL 0.5-2.5 (3) MG/3ML IN SOLN
3.0000 mL | Freq: Four times a day (QID) | RESPIRATORY_TRACT | Status: DC
  Administered 2024-07-21 – 2024-07-22 (×7): 3 mL via RESPIRATORY_TRACT
  Filled 2024-07-21 (×4): qty 3

## 2024-07-21 NOTE — H&P (Signed)
 History and Physical    Patient: Debra Galvan FMW:[id number] DOB: 1945/12/22 DOA: 07/21/2024 DOS: the patient was seen and examined on 07/21/2024 PCP: Stephane Leita DEL, MD  Patient coming from: Home  Chief Complaint:  Chief Complaint  Patient presents with   Respiratory Distress   HPI: Debra  BRYNNLEIGH Galvan is a 78 y.o. 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  She was put on CPAP, given magnesium  sulfate, 1 mg of epinephrine IM and a 5 mg albuterol  neb.  She was also just diagnosed with COVID-19 yesterday.  She denied fever, chills or hemoptysis.  No chest pain, palpitations, diaphoresis, PND, orthopnea or pitting edema of the lower extremities.  She has had diarrhea, but no abdominal pain, nausea, emesis, constipation, melena or hematochezia.  No flank pain, dysuria, frequency or hematuria.  No polyuria, polydipsia, polyphagia or blurred vision.   Lab work: CBC showed white count 22.3, hemoglobin 13.6 g/dL platelets 688.  Troponin was 65 ng/L.  BNP 202.6 pg/mL.  CMP showed potassium of  3.1 and chloride of 91 mmol/L, the rest of the electrolytes were and hepatic functions were normal.  Glucose 266, BUN 33 and creatinine 1.47 mg/dL.  Imaging: Portable 1 view chest radiograph showing possible mild basilar pulmonary edema.  ED course: Initial vital signs were temperature 98.5 F, pulse 61, respiration 22, BP 126/68 mmHg O2 sat 99% on room air.  The patient desaturated to the 70s and was placed temporarily on NRB mask.  She was on nasal cannula at 3 LPM and when  I evaluated her.  She received methylprednisolone  125 mg IVP and albuterol  10 mg continuous neb.   Review of Systems: As mentioned in the history of present illness. All other systems reviewed and are negative. 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 She has never used smokeless tobacco. She reports current alcohol use. She reports that she does not use drugs.  Allergies  Allergen Reactions   Monosodium Glutamate Shortness Of Breath   Atorvastatin Other (See Comments)    Myalgias   Sulfa Antibiotics Other (See Comments)    Reaction occurred during childhood-  reaction not recalled   Prednisone Other (See Comments)    Hyperactivity    Family History  Problem Relation Age of Onset   Stroke Mother    Stroke Father    Heart disease Father    Hypertension Father    Colon cancer Maternal Grandfather    Breast cancer Neg Hx    Colon polyps Neg Hx    Esophageal cancer Neg Hx    Stomach cancer Neg Hx    Rectal cancer Neg Hx    BRCA 1/2 Neg Hx     Prior to Admission medications   Medication Sig Start Date End Date Taking?  Authorizing Provider  acetaminophen  (TYLENOL ) 500 MG tablet Take 500 mg by mouth every 6 (six) hours as needed (for pain).   Yes [provider]  albuterol  (VENTOLIN  HFA) 108 (90 Base) MCG/ACT inhaler Inhale 1 puff into the lungs every 6 (six) hours as needed for wheezing or shortness of breath. 03/10/21  Yes [place], Eric J, DO  atenolol -chlorthalidone  (TENORETIC ) 100-25 MG per tablet Take 1 tablet by mouth daily.   Yes [provider]  calcium -vitamin D (OSCAL WITH D) 250-125 MG-UNIT tablet Take 1 tablet by mouth daily.   Yes [provider]  ergocalciferol (VITAMIN D2) 1.25 MG (50000 UT) capsule Take 50,000 Units by mouth every Saturday.   Yes [provider]  ezetimibe  (ZETIA ) 10 MG tablet Take 10 mg by mouth daily.   Yes [provider]  furosemide  (LASIX ) 20 MG tablet Take 1 tablet (20 mg total) by mouth daily. 07/18/24  Yes Monetta Redell PARAS, MD  losartan  (COZAAR ) 100 MG tablet Take 100 mg by mouth daily.   Yes [provider]  naproxen sodium (ALEVE) 220 MG tablet Take 220 mg by mouth 2 (two) times daily as needed (for pain).   Yes [provider]  nirmatrelvir /ritonavir , renal dosing, (PAXLOVID , 150/100,) 10 x 150 MG & 10 x 100MG  TBPK Take 2 tablets by mouth 2 (two) times daily for 5 days. Dosage for moderate renal impairment (eGFR >/= 30 to <60 mL/min): 150 mg nirmatrelvir  (one 150 mg tablet) with 100 mg ritonavir  (one 100 mg tablet), with both tablets taken together twice daily for 5 days. Not recommended if eGFR < 30 mL/min. PAXLOVID  is not recommend in patients with severe hepatic impairment (Child-Pugh Class C). 07/20/24 07/25/24 Yes White, Almarie LABOR, PA-C  [employer] Lancets Micro Thin 33G MISC daily. for testing 07/29/19   [provider]  TRUETRACK TEST test strip CHECK DAILY BLOOD SUGAR 07/29/19   [provider]    Physical Exam: Vitals:   07/21/24 1356 07/21/24 1400 07/21/24 1402  BP: 126/68    Pulse: 61     Resp: (!) 22    Temp: 98.5 F (36.9 C) 98.5 F (36.9 C)   TempSrc: Oral Oral   SpO2: 99%    Weight:   76.7 kg   Physical Exam Vitals reviewed.  HENT:     Head: Normocephalic.     Mouth/Throat:     Mouth: Mucous membranes are moist.  Eyes:     General: No scleral icterus.    Pupils: Pupils are equal, round, and reactive to light.  Musculoskeletal:     Right lower leg: No edema.     Left lower leg: No edema.  Neurological:     Mental Status: She is alert.     Data Reviewed:  Results are pending, will review when available. ECHOCARDIOGRAM REPORT. IMPRESSIONS:   1. The left  ventricle has normal systolic function with an ejection fraction of 60-65%. The cavity size was normal. Left ventricular diastolic Doppler parameters are consistent with impaired relaxation. No evidence of left ventricular regional wall motion abnormalities.  2. The right ventricle has normal systolic function. The cavity was normal. There is no increase in right ventricular wall thickness.  3. Left atrial size was mildly dilated.  4. The aortic valve is tricuspid. Mild calcification of the aortic valve.  5. The aorta is normal in size and structure.  EKG: Vent. rate 102 BPM PR interval 224 ms QRS duration 110 ms QT/QTcB 391/510 ms P-R-T axes -34 -69 88 Sinus tachycardia with irregular rate Prolonged PR interval Consider left atrial enlargement Abnormal R-wave progression, early transition LVH with IVCD, LAD and secondary repol abnrm Inferior infarct, old Prolonged QT interval  Assessment and Plan: Principal Problem:   Acute respiratory failure with hypoxia (HCC) In the setting of:   Asthma exacerbation In the setting of:   Allergic reaction  Superimposed on:   COVID-19 virus infection  And:   Cardiomegaly With:   Bibasilar pulmonary edema Observation/telemetry Continue supplemental oxygen. Continue Paxlovid  twice daily. Methylprednisolone  125 mg IVP x1. Does not tolerate  prednisone well. Continue methylprednisolone  40 mg in the morning. Scheduled and as needed bronchodilators. Sodium and fluid restriction. Begin furosemide  20 mg IVP twice daily. Continue daily atenolol  and losartan . Monitor daily weights, intake and output. Monitor renal function electrolytes. Check transthoracic echocardiogram. Follow-up CBC and chemistry in the morning.   Active Problems:   Hypokalemia Taking chlorthalidone  Taking furosemide  as well. Currently replacing potassium. Will likely need regular supplement.    Elevated troponin In the setting of acute dyspnea.    Essential hypertension Continue losartan  100 mg p.o. daily. Continue atenolol  and chlorthalidone  reduced dose.    Type 2 diabetes mellitus with diabetic chronic kidney disease (HCC) Received Solu-Medrol  125 mg IVP. Carbohydrate modified diet. CBG monitoring with RI SS. Check hemoglobin A1c.    Subclinical hypothyroidism Not on levothyroxine.    GERD (gastroesophageal reflux disease) Antiacid, H2 blocker or PPI as needed.    Hyperlipidemia Continue ezetimibe  10 mg p.o. daily.     Advance Care Planning:   Code Status: Full Code   Consults:   Family Communication: Her husband was at bedside.  Severity of Illness: The appropriate patient status for this patient is OBSERVATION. Observation status is judged to be reasonable and necessary in order to provide the required intensity of service to ensure the patient's safety. The patient's presenting symptoms, physical exam findings, and initial radiographic and laboratory data in the context of their medical condition is felt to place them at decreased risk for further clinical deterioration. Furthermore, it is anticipated that the patient will be medically stable for discharge from the hospital within 2 midnights of admission.   Author: Alm Dorn Castor, MD 07/21/2024 3:50 PM  For on call review [email or web address].   This document was prepared using  Dragon voice recognition software and may contain some unintended transcription errors.

## 2024-07-21 NOTE — ED Triage Notes (Signed)
 Pt arrives via EMS from home on CPAP. PT reports sudden onset shortness of breath. EMS arrived, noted gasping, and placed pt on CPAP.   EMS administered  2g Magnesium   1mg  Epi IM 5mg  Albuterol .

## 2024-07-21 NOTE — Progress Notes (Signed)
 EKG done with abnormal results- updated to EPIC. MD Celinda paged to be made aware. Patient denies chest pain- no c/o pain or SOB since admit to 1414. Pt states she did have back pain in ED, but none at this time.

## 2024-07-21 NOTE — ED Provider Notes (Signed)
 [place] [facility]ERGENC[facility] DEPARTMENT AT [facility] Provider Note  CSN: [id number] Arrival date & time: 07/21/24 1348  Chief Complaint(s) Respiratory Distress  HPI Debra  SHEVELLE SMITHER is a 78 y.o. female with history of diabetes, asthma, hypertension, hyperlipidemia, possible CHF presenting to the emergency department with shortness of breath.  Patient reports that she was eating fish and then developed shortness of breath.  Reports happened fairly suddenly.  Reports MSG allergy and was eating takeout food.  Denies any chest pain, back pain, abdominal pain, nausea, vomiting, rash, itching, sore throat, leg swelling, diarrhea.  Paramedics evaluated patient, gave IM epi, DuoNeb, magnesium  and reported some improvement.  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   IMO SNOMED Dx Update Oct 2024     Patient Active Problem List   Diagnosis Date Noted   Acute respiratory failure with hypoxia (HCC) 07/21/2024   Allergy    Anemia    Arthritis    Asthma    Cancer (HCC)    Cataract    Diabetes mellitus (HCC)    Diverticulosis    GERD (gastroesophageal reflux disease)    Heart murmur    Hyperlipidemia    Hypertension    Osteoporosis    Gout 07/13/2024   Subclinical hypothyroidism 07/13/2024    Colitis 03/08/2021   Leukocytosis 03/08/2021   Essential hypertension 03/08/2021   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 Authorizing Provider  acetaminophen  (TYLENOL ) 500 MG tablet Take 500 mg by mouth every 6 (six) hours as needed (for pain).   Yes [provider]  albuterol  (VENTOLIN  HFA) 108 (90 Base) MCG/ACT inhaler Inhale 1 puff into the lungs every 6 (six) hours as needed for wheezing or shortness of breath. 03/10/21  Yes [place], Camellia PARAS, DO  atenolol -chlorthalidone  (TENORETIC ) 100-25 MG per tablet Take 1 tablet by mouth daily.   Yes [provider]  calcium -vitamin D (OSCAL WITH D) 250-125 MG-UNIT tablet Take 1 tablet by mouth daily.   Yes [provider]  ergocalciferol (VITAMIN D2) 1.25 MG (50000 UT) capsule Take 50,000 Units by mouth every Saturday.   Yes [provider]  ezetimibe  (ZETIA ) 10 MG tablet Take 10 mg by mouth daily.   Yes [provider]  furosemide  (LASIX ) 20 MG tablet Take 1 tablet (20 mg total) by mouth daily. 07/18/24  Yes Monetta Redell PARAS, MD  losartan  (COZAAR ) 100 MG tablet Take 100 mg by mouth daily.  Yes [provider]  naproxen sodium (ALEVE) 220 MG tablet Take 220 mg by mouth 2 (two) times daily as needed (for pain).   Yes [provider]  nirmatrelvir /ritonavir , renal dosing, (PAXLOVID , 150/100,) 10 x 150 MG & 10 x 100MG  TBPK Take 2 tablets by mouth 2 (two) times daily for 5 days. Dosage for moderate renal impairment (eGFR >/= 30 to <60 mL/min): 150 mg nirmatrelvir  (one 150 mg tablet) with 100 mg ritonavir  (one 100 mg tablet), with both tablets taken together twice daily for 5 days. Not recommended if eGFR < 30 mL/min. PAXLOVID  is not  recommend in patients with severe hepatic impairment (Child-Pugh Class C). 07/20/24 07/25/24 Yes White, Almarie LABOR, PA-C  [employer] Lancets Micro Thin 33G MISC daily. for testing 07/29/19   [provider]  TRUETRACK TEST test strip CHECK DAILY BLOOD SUGAR 07/29/19   [provider]                                                                                                                                    Past Surgical History Past Surgical History:  Procedure Laterality Date   ABDOMINAL HYSTERECTOMY     APPENDECTOMY     COLONOSCOPY     POLYPECTOMY  2005   TA polyp   Family History Family History  Problem Relation Age of Onset   Stroke Mother    Stroke Father    Heart disease Father    Hypertension Father    Colon cancer Maternal Grandfather    Breast cancer Neg Hx    Colon polyps Neg Hx    Esophageal cancer Neg Hx    Stomach cancer Neg Hx    Rectal cancer Neg Hx    BRCA 1/2 Neg Hx     Social History Social History   Tobacco Use   Smoking status: Never   Smokeless tobacco: Never  Substance Use Topics   Alcohol use: Yes    Alcohol/week: 0.0 standard drinks of alcohol    Comment: 1 glass a month - rare    Drug use: Never   Allergies Monosodium glutamate, Atorvastatin, Sulfa antibiotics, and Prednisone  Review of Systems Review of Systems  All other systems reviewed and are negative.   Physical Exam Vital Signs  I have reviewed the triage vital signs BP 112/62   Pulse 60   Temp 98.5 F (36.9 C) (Oral)   Resp 15   Wt 76.7 kg   SpO2 98%   BMI 29.02 kg/m  Physical Exam Vitals and nursing note reviewed.  Constitutional:      General: She is not in acute distress.    Appearance: She is well-developed.  HENT:     Head: Normocephalic and atraumatic.     Mouth/Throat:     Mouth: Mucous membranes are moist.     Comments: No oropharyngeal edema Eyes:     Pupils: Pupils are equal, round, and reactive to  light.  Cardiovascular:     Rate and  Rhythm: Normal rate and regular rhythm.     Heart sounds: No murmur heard. Pulmonary:     Comments: Initially with increased work of breathing, possible wheezing although some transmitted upper airway sounds.  After receiving nebulized epinephrine reassessment with resolution of upper airway sounds and trace wheeze Abdominal:     General: Abdomen is flat.     Palpations: Abdomen is soft.     Tenderness: There is no abdominal tenderness.  Musculoskeletal:        General: No tenderness.     Right lower leg: No edema.     Left lower leg: No edema.  Skin:    General: Skin is warm and dry.     Comments: No rash or hives  Neurological:     General: No focal deficit present.     Mental Status: She is alert. Mental status is at baseline.  Psychiatric:        Mood and Affect: Mood normal.        Behavior: Behavior normal.     ED Results and Treatments Labs (all labs ordered are listed, but only abnormal results are displayed) Labs Reviewed  COMPREHENSIVE METABOLIC PANEL WITH GFR - Abnormal; Notable for the following components:      Result Value   Potassium 3.1 (*)    Chloride 91 (*)    Glucose, Bld 266 (*)    BUN 33 (*)    Creatinine, Ser 1.47 (*)    GFR, Estimated 36 (*)    Anion gap 17 (*)    All other components within normal limits  CBC WITH DIFFERENTIAL/PLATELET - Abnormal; Notable for the following components:   WBC 22.3 (*)    Neutro Abs 16.5 (*)    Monocytes Absolute 1.7 (*)    Abs Immature Granulocytes 0.14 (*)    All other components within normal limits  BRAIN NATRIURETIC PEPTIDE - Abnormal; Notable for the following components:   B Natriuretic Peptide 202.6 (*)    All other components within normal limits  BLOOD GAS, VENOUS - Abnormal; Notable for the following components:   pCO2, Ven 63 (*)    pO2, Ven <31 (*)    Bicarbonate 31.7 (*)    Acid-Base Excess 3.6 (*)    All other components within normal limits  TROPONIN I (HIGH SENSITIVITY) - Abnormal; Notable for  the following components:   Troponin I (High Sensitivity) 65 (*)    All other components within normal limits  MAGNESIUM   PHOSPHORUS  TROPONIN I (HIGH SENSITIVITY)                                                                                                                          Radiology DG Chest Portable 1 View Result Date: 07/21/2024 CLINICAL DATA:  Chest pain, sudden onset shortness of breath. EXAM: PORTABLE CHEST 1 VIEW COMPARISON:  07/13/2024 and CT chest 07/13/2024. FINDINGS: Trachea is midline. Heart is enlarged,  stable. There may be minimal bibasilar interstitial prominence with peripheral septal lines. No definite pleural fluid. IMPRESSION: Possible mild basilar pulmonary edema. Electronically Signed   By: Newell Eke M.D.   On: 07/21/2024 14:50    Pertinent labs & imaging results that were available during my care of the patient were reviewed by me and considered in my medical decision making (see MDM for details).  Medications Ordered in ED Medications  albuterol  (PROVENTIL ) (2.5 MG/3ML) 0.083% nebulizer solution 10 mg (10 mg Nebulization Not Given 07/21/24 1429)  potassium chloride  SA (KLOR-CON  M) CR tablet 40 mEq (has no administration in time range)  Racepinephrine HCl 2.25 % nebulizer solution (15 mLs  Given 07/21/24 1404)  methylPREDNISolone  sodium succinate (SOLU-MEDROL ) 125 mg/2 mL injection 125 mg (125 mg Intravenous Given 07/21/24 1409)  albuterol  (PROVENTIL ) (2.5 MG/3ML) 0.083% nebulizer solution 10 mg (10 mg Nebulization Given 07/21/24 1446)                                                                                                                                     Procedures Procedures  (including critical care time)  Medical Decision Making / ED Course   MDM:  78 year old presenting to the emergency department with shortness of breath.  Differential includes allergic reaction, asthma exacerbation, CHF, pneumothorax, pneumonia.  Symptoms seem to  have improved significantly.  Was in the ER recently around 1 week ago for hypoxia, subsequently has seen cardiology and scheduled for echocardiogram started on Lasix .  Cardiology suspects she may have CHF.  Clinically currently does not appear volume overloaded however.  Clinical Course as of 07/21/24 1658  Sun Jul 21, 2024  1654 Workup with elevated troponin, ECG wo stemi. Also with elevated BNP, CXR with signs of pulmonary edema. Lungs clear on exam and patient overall feels stable. Seems to have improved with duonebs and steroids so clinically seems more asthma/allergy related. Given unclear picture patient would benefit from admission for further inpatient care and management. Discussed with Dr. Celinda who has accepted patient for admission.  [WS]    Clinical Course User Index [WS] Francesca Elsie CROME, MD     Additional history obtained: -Additional history obtained from ems and spouse -External records from outside source obtained and reviewed including: Chart review including previous notes, labs, imaging, consultation notes including prior cardiology/ER notes    Lab Tests: -I ordered, reviewed, and interpreted labs.   The pertinent results include:   Labs Reviewed  COMPREHENSIVE METABOLIC PANEL WITH GFR - Abnormal; Notable for the following components:      Result Value   Potassium 3.1 (*)    Chloride 91 (*)    Glucose, Bld 266 (*)    BUN 33 (*)    Creatinine, Ser 1.47 (*)    GFR, Estimated 36 (*)    Anion gap 17 (*)    All other components within normal limits  CBC WITH DIFFERENTIAL/PLATELET - Abnormal; Notable  for the following components:   WBC 22.3 (*)    Neutro Abs 16.5 (*)    Monocytes Absolute 1.7 (*)    Abs Immature Granulocytes 0.14 (*)    All other components within normal limits  BRAIN NATRIURETIC PEPTIDE - Abnormal; Notable for the following components:   B Natriuretic Peptide 202.6 (*)    All other components within normal limits  BLOOD GAS, VENOUS -  Abnormal; Notable for the following components:   pCO2, Ven 63 (*)    pO2, Ven <31 (*)    Bicarbonate 31.7 (*)    Acid-Base Excess 3.6 (*)    All other components within normal limits  TROPONIN I (HIGH SENSITIVITY) - Abnormal; Notable for the following components:   Troponin I (High Sensitivity) 65 (*)    All other components within normal limits  MAGNESIUM   PHOSPHORUS  TROPONIN I (HIGH SENSITIVITY)    Notable for elevated troponin, elevated BNP  EKG   EKG Interpretation Date/Time:  Sunday July 21 2024 14:02:46 EDT Ventricular Rate:  102 PR Interval:  224 QRS Duration:  110 QT Interval:  391 QTC Calculation: 510 R Axis:   -69  Text Interpretation: Sinus tachycardia with irregular rate Prolonged PR interval Consider left atrial enlargement Abnormal R-wave progression, early transition LVH with IVCD, LAD and secondary repol abnrm Inferior infarct, old Prolonged QT interval Confirmed by Francesca Fallow ([id number]) on 07/21/2024 3:49:05 PM         Imaging Studies ordered: I ordered imaging studies including CXR On my interpretation imaging demonstrates possible pulm edema  I independently visualized and interpreted imaging. I agree with the radiologist interpretation   Medicines ordered and prescription drug management: Meds ordered this encounter  Medications   Racepinephrine HCl 2.25 % nebulizer solution    Smitty Anna N: cabinet override   methylPREDNISolone  sodium succinate (SOLU-MEDROL ) 125 mg/2 mL injection 125 mg    IV methylprednisolone  will be converted to either a q12h or q24h frequency with the same total daily dose (TDD).  Ordered Dose: 1 to 125 mg TDD; convert to: TDD q24h.  Ordered Dose: 126 to 250 mg TDD; convert to: TDD div q12h.  Ordered Dose: >250 mg TDD; DAW.   albuterol  (PROVENTIL ) (2.5 MG/3ML) 0.083% nebulizer solution 10 mg   albuterol  (PROVENTIL ) (2.5 MG/3ML) 0.083% nebulizer solution 10 mg   potassium chloride  SA (KLOR-CON  M) CR tablet 40 mEq     -I have reviewed the patients home medicines and have made adjustments as needed   Consultations Obtained: I requested consultation with the hospitalist,  and discussed lab and imaging findings as well as pertinent plan - they recommend: admission   Cardiac Monitoring: The patient was maintained on a cardiac monitor.  I personally viewed and interpreted the cardiac monitored which showed an underlying rhythm of: NSR with pac  Reevaluation: After the interventions noted above, I reevaluated the patient and found that their symptoms have improved  Co morbidities that complicate the patient evaluation  Past Medical History:  Diagnosis Date   Allergy    Anemia    Arthritis    shoulder   Asthma    Cancer (HCC)    skin cancer   Cataract    small forming    Colitis 03/08/2021   Diabetes mellitus (HCC)    Diverticulosis    Essential hypertension 03/08/2021   GERD (gastroesophageal reflux disease)    Gout 07/13/2024   Heart murmur    Hyperlipidemia    Hypertension    Leukocytosis 03/08/2021  Mild intermittent asthma 12/25/2019   Mixed hyperlipidemia 12/26/2019   Osteoporosis    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    Final Clinical Impression(s) / ED Diagnoses Final diagnoses:  Acute hypoxic respiratory failure (HCC)     This chart was dictated using voice recognition software.  Despite best efforts to proofread,  errors can occur which can change the documentation meaning.    Francesca Elsie CROME, MD 07/21/24 [phone]

## 2024-07-22 ENCOUNTER — Observation Stay (HOSPITAL_BASED_OUTPATIENT_CLINIC_OR_DEPARTMENT_OTHER)

## 2024-07-22 ENCOUNTER — Telehealth: Payer: Self-pay | Admitting: Cardiology

## 2024-07-22 DIAGNOSIS — R0609 Other forms of dyspnea: Secondary | ICD-10-CM | POA: Diagnosis not present

## 2024-07-22 DIAGNOSIS — J9601 Acute respiratory failure with hypoxia: Secondary | ICD-10-CM | POA: Diagnosis not present

## 2024-07-22 LAB — COMPREHENSIVE METABOLIC PANEL WITH GFR
ALT: 25 U/L (ref 0–44)
AST: 27 U/L (ref 15–41)
Albumin: 3.4 g/dL — ABNORMAL LOW (ref 3.5–5.0)
Alkaline Phosphatase: 53 U/L (ref 38–126)
Anion gap: 12 (ref 5–15)
BUN: 39 mg/dL — ABNORMAL HIGH (ref 8–23)
CO2: 25 mmol/L (ref 22–32)
Calcium: 9.3 mg/dL (ref 8.9–10.3)
Chloride: 97 mmol/L — ABNORMAL LOW (ref 98–111)
Creatinine, Ser: 1.31 mg/dL — ABNORMAL HIGH (ref 0.44–1.00)
GFR, Estimated: 42 mL/min — ABNORMAL LOW (ref >60)
Glucose, Bld: 228 mg/dL — ABNORMAL HIGH (ref 70–99)
Potassium: 4.1 mmol/L (ref 3.5–5.1)
Sodium: 134 mmol/L — ABNORMAL LOW (ref 135–145)
Total Bilirubin: 0.7 mg/dL (ref 0.0–1.2)
Total Protein: 7.7 g/dL (ref 6.5–8.1)

## 2024-07-22 LAB — ECHOCARDIOGRAM COMPLETE
AR max vel: 3.14 cm2
AV Area VTI: 3.24 cm2
AV Area mean vel: 3.02 cm2
AV Mean grad: 5 mmHg
AV Peak grad: 8.8 mmHg
Ao pk vel: 1.48 m/s
Area-P 1/2: 3.27 cm2
Height: 64 in
S' Lateral: 2.8 cm
Weight: 2652.57 [oz_av]

## 2024-07-22 LAB — CBC
HCT: 42.4 % (ref 36.0–46.0)
Hemoglobin: 13.6 g/dL (ref 12.0–15.0)
MCH: 29.4 pg (ref 26.0–34.0)
MCHC: 32.1 g/dL (ref 30.0–36.0)
MCV: 91.6 fL (ref 80.0–100.0)
Platelets: 249 K/uL (ref 150–400)
RBC: 4.63 MIL/uL (ref 3.87–5.11)
RDW: 12.1 % (ref 11.5–15.5)
WBC: 9.4 K/uL (ref 4.0–10.5)
nRBC: 0 % (ref 0.0–0.2)

## 2024-07-22 LAB — GLUCOSE, CAPILLARY
Glucose-Capillary: 229 mg/dL — ABNORMAL HIGH (ref 70–99)
Glucose-Capillary: 264 mg/dL — ABNORMAL HIGH (ref 70–99)
Glucose-Capillary: 281 mg/dL — ABNORMAL HIGH (ref 70–99)

## 2024-07-22 NOTE — Discharge Summary (Signed)
 Physician Discharge Summary  Jasman  DEBANY VANTOL FMW:[id number] DOB: Jun 02, 1946 DOA: 07/21/2024  PCP: Stephane Leita DEL, MD  Admit date: 07/21/2024 Discharge date: 07/22/2024  Admitted From: Home Disposition: Home  Recommendations for Outpatient Follow-up:  Follow up with PCP in 1-2 weeks Follow-up with speech therapy, ENT as discussed  Home Health: None Equipment/Devices: None   Discharge Condition: Stable CODE STATUS: Full Diet recommendation: Soft bland diet  Brief/Interim Summary: Yan  N Blowe is a 78 y.o. 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  Patient admitted as above with complaint of acute respiratory failure after eating fish.  Patient was treated for presumed allergic reaction although had no notable facial edema, rash, swelling or wheezing.  She reports that she was blue at home before EMS arrived.  She reports 18 months of worsening dysphagia, unclear if patient had transient aspiration or choking episode given her rapid and remarkable recovery over the past 12 hours.  Speech has been requested for bedside evaluation, would likely benefit from outpatient barium esophagram as well as ENT evaluation for possible vocal cord/laryngeal dysfunction as she carries a diagnosis of asthma but has never actually had PFTs or been evaluated by pulmonologist.  Echocardiogram pending based on chest x-ray with reports of cardiomegaly, appears to be chronic per prior imaging. Incidentally noted patient is positive for covid at intake despite recent negative testing. No other known sick contacts. Mildly elevated troponin in the setting of hypoxia/covid and multiple medications at intake including  epinephrine.  At this time given patient's rapid recovery negative imaging and findings she is otherwise stable and agreeable for discharge home.  Recommend close follow-up with PCP in the next 1 to 2 weeks as well as follow-up outpatient with speech therapy and ENT as described above.  Discharge Diagnoses:  Principal Problem:   Acute respiratory failure with hypoxia (HCC) Active Problems:   Essential hypertension   Type 2 diabetes mellitus with diabetic chronic kidney disease (HCC)   Subclinical hypothyroidism   GERD (gastroesophageal reflux disease)   Hyperlipidemia   Asthma exacerbation   Hypokalemia   Elevated troponin   Cardiomegaly   Bibasilar pulmonary edema   COVID-19 virus infection   Allergic reaction    Discharge Instructions  Discharge Instructions     Call MD for:  difficulty breathing, headache or visual disturbances   Complete by: As directed    Call MD for:  extreme fatigue   Complete by: As directed    Call MD for:  hives   Complete by: As directed    Call MD for:  persistant dizziness or light-headedness   Complete by: As directed    Call MD for:  persistant nausea and vomiting   Complete by: As directed    Call MD for:  severe uncontrolled pain   Complete by: As directed    Call MD for:  temperature >100.4   Complete by: As directed    Diet - low sodium heart healthy   Complete by: As directed    Increase activity slowly   Complete by: As directed       Allergies as of 07/22/2024       Reactions   Monosodium Glutamate Shortness Of Breath   Atorvastatin Other (See Comments)   Myalgias   Sulfa Antibiotics Other (See Comments)   Reaction occurred  during childhood- reaction not recalled   Prednisone Other (See Comments)   Hyperactivity        Medication List     TAKE these medications    acetaminophen  500 MG tablet Commonly known as: TYLENOL  Take 500 mg by mouth every 6 (six) hours as needed (for pain).   albuterol  108 (90 Base)  MCG/ACT inhaler Commonly known as: VENTOLIN  HFA Inhale 1 puff into the lungs every 6 (six) hours as needed for wheezing or shortness of breath.   atenolol -chlorthalidone  100-25 MG tablet Commonly known as: TENORETIC  Take 1 tablet by mouth daily.   calcium -vitamin D 250-125 MG-UNIT tablet Commonly known as: OSCAL WITH D Take 1 tablet by mouth daily.   [employer] Lancets Micro Thin 33G Misc daily. for testing   ergocalciferol 1.25 MG (50000 UT) capsule Commonly known as: VITAMIN D2 Take 50,000 Units by mouth every Saturday.   ezetimibe  10 MG tablet Commonly known as: ZETIA  Take 10 mg by mouth daily.   furosemide  20 MG tablet Commonly known as: LASIX  Take 1 tablet (20 mg total) by mouth daily.   losartan  100 MG tablet Commonly known as: COZAAR  Take 100 mg by mouth daily.   naproxen sodium 220 MG tablet Commonly known as: ALEVE Take 220 mg by mouth 2 (two) times daily as needed (for pain).   Paxlovid  (150/100) 10 x 150 MG & 10 x 100MG  Tbpk Generic drug: nirmatrelvir /ritonavir  (renal dosing) Take 2 tablets by mouth 2 (two) times daily for 5 days. Dosage for moderate renal impairment (eGFR >/= 30 to <60 mL/min): 150 mg nirmatrelvir  (one 150 mg tablet) with 100 mg ritonavir  (one 100 mg tablet), with both tablets taken together twice daily for 5 days. Not recommended if eGFR < 30 mL/min. PAXLOVID  is not recommend in patients with severe hepatic impairment (Child-Pugh Class C).   TrueTrack Test test strip Generic drug: glucose blood CHECK DAILY BLOOD SUGAR        Allergies  Allergen Reactions   Monosodium Glutamate Shortness Of Breath   Atorvastatin Other (See Comments)    Myalgias   Sulfa Antibiotics Other (See Comments)    Reaction occurred during childhood- reaction not recalled   Prednisone Other (See Comments)    Hyperactivity    Consultations: None   Procedures/Studies: DG Chest Portable 1 View Result Date: 07/21/2024 CLINICAL DATA:  Chest pain, sudden onset  shortness of breath. EXAM: PORTABLE CHEST 1 VIEW COMPARISON:  07/13/2024 and CT chest 07/13/2024. FINDINGS: Trachea is midline. Heart is enlarged, stable. There may be minimal bibasilar interstitial prominence with peripheral septal lines. No definite pleural fluid. IMPRESSION: Possible mild basilar pulmonary edema. Electronically Signed   By: Newell Eke M.D.   On: 07/21/2024 14:50   CT Angio Chest PE W and/or Wo Contrast Result Date: 07/13/2024 CLINICAL DATA:  Pulmonary embolism (PE) suspected, high prob, hypoxia EXAM: CT ANGIOGRAPHY CHEST WITH CONTRAST TECHNIQUE: Multidetector CT imaging of the chest was performed using the standard protocol during bolus administration of intravenous contrast. Multiplanar CT image reconstructions and MIPs were obtained to evaluate the vascular anatomy. RADIATION DOSE REDUCTION: This exam was performed according to the departmental dose-optimization program which includes automated exposure control, adjustment of the mA and/or kV according to patient size and/or use of iterative reconstruction technique. CONTRAST:  75mL OMNIPAQUE  IOHEXOL  350 MG/ML SOLN COMPARISON:  07/13/2024, 09/25/2020 FINDINGS: Cardiovascular: This is a technically adequate evaluation of the pulmonary vasculature. No filling defects or pulmonary emboli. The heart is unremarkable without pericardial effusion. Normal caliber of the  thoracic aorta. Atherosclerosis of the aorta and coronary vasculature. Mediastinum/Nodes: No enlarged mediastinal, hilar, or axillary lymph nodes. Thyroid gland, trachea, and esophagus demonstrate no significant findings. Lungs/Pleura: There is dependent interlobular septal thickening with scattered dependent ground-glass opacities most pronounced within the lower lobes. Findings favor mild volume overload and developing pulmonary edema. No effusion or pneumothorax. The central airways are patent. Upper Abdomen: No acute abnormality. Musculoskeletal: No acute or destructive bony  abnormalities. Reconstructed images demonstrate no additional findings. Review of the MIP images confirms the above findings. IMPRESSION: 1. No evidence of pulmonary embolus. 2. Interlobular septal thickening and scattered dependent ground-glass airspace disease, consistent with mild pulmonary edema. 3. Aortic Atherosclerosis (ICD10-I70.0). Coronary artery atherosclerosis. Electronically Signed   By: Ozell Daring M.D.   On: 07/13/2024 16:06   DG Chest Portable 1 View Result Date: 07/13/2024 EXAM: 1 VIEW XRAY OF THE CHEST 07/13/2024 01:55:00 PM COMPARISON: None available. CLINICAL HISTORY: Hypoxia. Patient BIB EMS for panic attack. Patient from home. Patient got in argument/emotional conversation with her son and started to have a panic attack per EMS. Patient does not remember having a panic attack. Patient in A Fib, patient denies having history of A Fib. FINDINGS: LUNGS AND PLEURA: No focal pulmonary opacity. No pulmonary edema. No pleural effusion. No pneumothorax. HEART AND MEDIASTINUM: No acute abnormality of the cardiac and mediastinal silhouettes. BONES AND SOFT TISSUES: No acute osseous abnormality. IMPRESSION: 1. No acute process. Electronically signed by: evalene coho 07/13/2024 02:00 PM EDT RP Workstation: [id number]     Subjective: No acute issues/event overnight   Discharge Exam: Vitals:   07/22/24 1139 07/22/24 1329  BP:  118/65  Pulse:  81  Resp:  20  Temp:  98.3 F (36.8 C)  SpO2: 94% 95%   Vitals:   07/22/24 0500 07/22/24 0859 07/22/24 1139 07/22/24 1329  BP:    118/65  Pulse:    81  Resp:    20  Temp:    98.3 F (36.8 C)  TempSrc:      SpO2:  99% 94% 95%  Weight: 75.2 kg     Height:        General: Pt is alert, awake, not in acute distress Cardiovascular: RRR, S1/S2 +, no rubs, no gallops Respiratory: CTA bilaterally, no wheezing, no rhonchi Abdominal: Soft, NT, ND, bowel sounds + Extremities: no edema, no cyanosis   The results of significant  diagnostics from this hospitalization (including imaging, microbiology, ancillary and laboratory) are listed below for reference.     Microbiology: Recent Results (from the past 240 hours)  Resp panel by RT-PCR (RSV, Flu A&B, Covid) Anterior Nasal Swab     Status: None   Collection Time: 07/13/24  3:20 PM   Specimen: Anterior Nasal Swab  Result Value Ref Range Status   SARS Coronavirus 2 by RT PCR NEGATIVE NEGATIVE Final    Comment: (NOTE) SARS-CoV-2 target nucleic acids are NOT DETECTED.  The SARS-CoV-2 RNA is generally detectable in upper respiratory specimens during the acute phase of infection. The lowest concentration of SARS-CoV-2 viral copies this assay can detect is 138 copies/mL. A negative result does not preclude SARS-Cov-2 infection and should not be used as the sole basis for treatment or other patient management decisions. A negative result may occur with  improper specimen collection/handling, submission of specimen other than nasopharyngeal swab, presence of viral mutation(s) within the areas targeted by this assay, and inadequate number of viral copies(<138 copies/mL). A negative result must be combined with clinical observations, patient  history, and epidemiological information. The expected result is Negative.  Fact Sheet for Patients:  BloggerCourse.com  Fact Sheet for Healthcare Providers:  SeriousBroker.it  This test is no t yet approved or cleared by the [place]  FDA and  has been authorized for detection and/or diagnosis of SARS-CoV-2 by FDA under an Emergency Use Authorization (EUA). This EUA will remain  in effect (meaning this test can be used) for the duration of the COVID-19 declaration under Section 564(b)(1) of the Act, 21 U.S.C.section 360bbb-3(b)(1), unless the authorization is terminated  or revoked sooner.       Influenza A by PCR NEGATIVE NEGATIVE Final   Influenza B by PCR NEGATIVE  NEGATIVE Final    Comment: (NOTE) The Xpert Xpress SARS-CoV-2/FLU/RSV plus assay is intended as an aid in the diagnosis of influenza from Nasopharyngeal swab specimens and should not be used as a sole basis for treatment. Nasal washings and aspirates are unacceptable for Xpert Xpress SARS-CoV-2/FLU/RSV testing.  Fact Sheet for Patients: BloggerCourse.com  Fact Sheet for Healthcare Providers: SeriousBroker.it  This test is not yet approved or cleared by the [place]  FDA and has been authorized for detection and/or diagnosis of SARS-CoV-2 by FDA under an Emergency Use Authorization (EUA). This EUA will remain in effect (meaning this test can be used) for the duration of the COVID-19 declaration under Section 564(b)(1) of the Act, 21 U.S.C. section 360bbb-3(b)(1), unless the authorization is terminated or revoked.     Resp Syncytial Virus by PCR NEGATIVE NEGATIVE Final    Comment: (NOTE) Fact Sheet for Patients: BloggerCourse.com  Fact Sheet for Healthcare Providers: SeriousBroker.it  This test is not yet approved or cleared by the [place]  FDA and has been authorized for detection and/or diagnosis of SARS-CoV-2 by FDA under an Emergency Use Authorization (EUA). This EUA will remain in effect (meaning this test can be used) for the duration of the COVID-19 declaration under Section 564(b)(1) of the Act, 21 U.S.C. section 360bbb-3(b)(1), unless the authorization is terminated or revoked.  Performed at [facility], [address], [place], [place] [address]      Labs: BNP (last 3 results) Recent Labs    07/13/24 1251 07/21/24 1425  BNP 139.4* 202.6*   Basic Metabolic Panel: Recent Labs  Lab 07/21/24 1425 07/21/24 1749 07/22/24 0459  NA 135  --  134*  K 3.1*  --  4.1  CL 91*  --  97*  CO2 27  --  25  GLUCOSE 266*  --  228*  BUN 33*   --  39*  CREATININE 1.47*  --  1.31*  CALCIUM  9.2  --  9.3  MG  --  2.6*  --   PHOS  --  3.9  --    Liver Function Tests: Recent Labs  Lab 07/21/24 1425 07/22/24 0459  AST 33 27  ALT 23 25  ALKPHOS 58 53  BILITOT 1.1 0.7  PROT 8.0 7.7  ALBUMIN 3.6 3.4*   No results for input(s): LIPASE, AMYLASE in the last 168 hours. No results for input(s): AMMONIA in the last 168 hours. CBC: Recent Labs  Lab 07/21/24 1425 07/22/24 0459  WBC 22.3* 9.4  NEUTROABS 16.5*  --   HGB 13.6 13.6  HCT 41.7 42.4  MCV 93.1 91.6  PLT 311 249   CBG: Recent Labs  Lab 07/21/24 1847 07/21/24 2118 07/22/24 0735 07/22/24 1211  GLUCAP 224* 310* 229* 264*   Urinalysis    Component Value Date/Time   COLORURINE YELLOW  03/08/2021 1944   APPEARANCEUR CLEAR 03/08/2021 1944   LABSPEC 1.024 03/08/2021 1944   PHURINE 5.0 03/08/2021 1944   GLUCOSEU NEGATIVE 03/08/2021 1944   HGBUR SMALL (A) 03/08/2021 1944   BILIRUBINUR NEGATIVE 03/08/2021 1944   KETONESUR 5 (A) 03/08/2021 1944   PROTEINUR NEGATIVE 03/08/2021 1944   NITRITE NEGATIVE 03/08/2021 1944   LEUKOCYTESUR NEGATIVE 03/08/2021 1944   Sepsis Labs Recent Labs  Lab 07/21/24 1425 07/22/24 0459  WBC 22.3* 9.4   Microbiology Recent Results (from the past 240 hours)  Resp panel by RT-PCR (RSV, Flu A&B, Covid) Anterior Nasal Swab     Status: None   Collection Time: 07/13/24  3:20 PM   Specimen: Anterior Nasal Swab  Result Value Ref Range Status   SARS Coronavirus 2 by RT PCR NEGATIVE NEGATIVE Final    Comment: (NOTE) SARS-CoV-2 target nucleic acids are NOT DETECTED.  The SARS-CoV-2 RNA is generally detectable in upper respiratory specimens during the acute phase of infection. The lowest concentration of SARS-CoV-2 viral copies this assay can detect is 138 copies/mL. A negative result does not preclude SARS-Cov-2 infection and should not be used as the sole basis for treatment or other patient management decisions. A negative  result may occur with  improper specimen collection/handling, submission of specimen other than nasopharyngeal swab, presence of viral mutation(s) within the areas targeted by this assay, and inadequate number of viral copies(<138 copies/mL). A negative result must be combined with clinical observations, patient history, and epidemiological information. The expected result is Negative.  Fact Sheet for Patients:  BloggerCourse.com  Fact Sheet for Healthcare Providers:  SeriousBroker.it  This test is no t yet approved or cleared by the [place]  FDA and  has been authorized for detection and/or diagnosis of SARS-CoV-2 by FDA under an Emergency Use Authorization (EUA). This EUA will remain  in effect (meaning this test can be used) for the duration of the COVID-19 declaration under Section 564(b)(1) of the Act, 21 U.S.C.section 360bbb-3(b)(1), unless the authorization is terminated  or revoked sooner.       Influenza A by PCR NEGATIVE NEGATIVE Final   Influenza B by PCR NEGATIVE NEGATIVE Final    Comment: (NOTE) The Xpert Xpress SARS-CoV-2/FLU/RSV plus assay is intended as an aid in the diagnosis of influenza from Nasopharyngeal swab specimens and should not be used as a sole basis for treatment. Nasal washings and aspirates are unacceptable for Xpert Xpress SARS-CoV-2/FLU/RSV testing.  Fact Sheet for Patients: BloggerCourse.com  Fact Sheet for Healthcare Providers: SeriousBroker.it  This test is not yet approved or cleared by the [place]  FDA and has been authorized for detection and/or diagnosis of SARS-CoV-2 by FDA under an Emergency Use Authorization (EUA). This EUA will remain in effect (meaning this test can be used) for the duration of the COVID-19 declaration under Section 564(b)(1) of the Act, 21 U.S.C. section 360bbb-3(b)(1), unless the authorization is  terminated or revoked.     Resp Syncytial Virus by PCR NEGATIVE NEGATIVE Final    Comment: (NOTE) Fact Sheet for Patients: BloggerCourse.com  Fact Sheet for Healthcare Providers: SeriousBroker.it  This test is not yet approved or cleared by the [place]  FDA and has been authorized for detection and/or diagnosis of SARS-CoV-2 by FDA under an Emergency Use Authorization (EUA). This EUA will remain in effect (meaning this test can be used) for the duration of the COVID-19 declaration under Section 564(b)(1) of the Act, 21 U.S.C. section 360bbb-3(b)(1), unless the authorization is terminated or revoked.  Performed at [facility]  [facility], [address] [place], [place] [address]      Time coordinating discharge: Over 30 minutes  SIGNED:   Elsie JAYSON [place], DO [facility] 07/22/2024, 3:49 PM Pager   If 7PM-7AM, please contact night-coverage [email or web address]

## 2024-07-22 NOTE — Care Management Obs Status (Signed)
 MEDICARE OBSERVATION STATUS NOTIFICATION   Patient Details  Name: Debra Galvan MRN: [id number] Date of Birth: [birth year]   Medicare Observation Status Notification Given:  Yes    Duwaine GORMAN Aran, LCSW 07/22/2024, 2:47 PM

## 2024-07-22 NOTE — Inpatient Diabetes Management (Signed)
 Inpatient Diabetes Program Recommendations  AACE/ADA: New Consensus Statement on Inpatient Glycemic Control (2015)  Target Ranges:  Prepandial:   less than 140 mg/dL      Peak postprandial:   less than 180 mg/dL (1-2 hours)      Critically ill patients:  140 - 180 mg/dL   Lab Results  Component Value Date   GLUCAP 264 (H) 07/22/2024   HGBA1C 6.9 (H) 03/08/2021    Review of Glycemic Control  Diabetes history: DM2 Outpatient Diabetes medications: None Current orders for Inpatient glycemic control: Novolog  0-9 TID with meals  On Solumedrol 40 mg daily  Inpatient Diabetes Program Recommendations:    Consider adding Novolog  2-3 units TID with meals if eating > 50%  Will continue to follow.  Thank you. Shona Brandy, RD, LDN, CDCES Inpatient Diabetes Coordinator [phone]

## 2024-07-22 NOTE — TOC Initial Note (Signed)
 Transition of Care [facility]) - Initial/Assessment Note   Patient Details  Name: Debra Galvan MRN: [id number] Date of Birth: [birth year]  [facility] of [facility] CM/SW Contact:    Duwaine GORMAN Aran, LCSW Phone Number: 07/22/2024, 9:20 AM  Clinical Narrative: Patient is from home with spouse. Care management consulted for heart failure screening. Patient has had one ED visit and one hospital admission in the past 6 months. Patient referred to heart failure navigation team, so consult will be cleared at this time. Patient currently requiring 3L/min oxygen. Care management following for possible home oxygen needs.  Expected Discharge Plan: Home/Self Care Barriers to Discharge: Continued Medical Work up  Expected Discharge Plan and Services In-house Referral: Clinical Social Work Living arrangements for the past 2 months: Single Family Home  Prior Living Arrangements/Services Living arrangements for the past 2 months: Single Family Home Lives with:: Spouse Patient language and need for interpreter reviewed:: Yes Do you feel safe going back to the place where you live?: Yes      Need for Family Participation in Patient Care: No (Comment) Care giver support system in place?: Yes (comment) Criminal Activity/Legal Involvement Pertinent to Current Situation/Hospitalization: No - Comment as needed  Activities of Daily Living ADL Screening (condition at time of admission) Independently performs ADLs?: Yes (appropriate for developmental age) Is the patient deaf or have difficulty hearing?: No Does the patient have difficulty seeing, even when wearing glasses/contacts?: No Does the patient have difficulty concentrating, remembering, or making decisions?: No  Emotional Assessment Alcohol / Substance Use: Not Applicable Psych Involvement: No (comment)  Admission diagnosis:  Acute respiratory failure with hypoxia (HCC) [J96.01] Acute hypoxic respiratory failure (HCC) [J96.01] Patient Active  Problem List   Diagnosis Date Noted   Acute respiratory failure with hypoxia (HCC) 07/21/2024   Asthma exacerbation 07/21/2024   Hypokalemia 07/21/2024   Elevated troponin 07/21/2024   Cardiomegaly 07/21/2024   Bibasilar pulmonary edema 07/21/2024   COVID-19 virus infection 07/21/2024   Allergic reaction 07/21/2024   Allergy    Anemia    Arthritis    Asthma    Cancer (HCC)    Cataract    Diabetes mellitus (HCC)    Diverticulosis    GERD (gastroesophageal reflux disease)    Heart murmur    Hyperlipidemia    Hypertension    Osteoporosis    Gout 07/13/2024   Subclinical hypothyroidism 07/13/2024   Colitis 03/08/2021   Leukocytosis 03/08/2021   Essential hypertension 03/08/2021   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   Mixed hyperlipidemia 12/26/2019   Mild intermittent asthma 12/25/2019   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   PCP:  Stephane Leita DEL, MD Pharmacy:   [phone] [place], [place] - [address] [place] [address] Phone: [phone] Fax: [phone]  Social Drivers of Health (SDOH) Social History: SDOH Screenings   Food Insecurity: No Food Insecurity (07/21/2024)  Housing: Low Risk  (07/21/2024)  Transportation Needs: No Transportation Needs (07/21/2024)  Utilities: Not At Risk (07/21/2024)  Social Connections: Unknown (07/21/2024)  Tobacco Use: Low Risk  (07/21/2024)   SDOH Interventions:    Readmission Risk Interventions     No data to display

## 2024-07-22 NOTE — Telephone Encounter (Signed)
 Left message for the patient to call back.

## 2024-07-22 NOTE — Progress Notes (Signed)
 SATURATION QUALIFICATIONS: (This note is used to comply with regulatory documentation for home oxygen)  Patient Saturations on Room Air at Rest = 95%  Patient Saturations on Room Air while Ambulating = 94%  Please briefly explain why patient needs home oxygen: Pt does not need home O2.

## 2024-07-22 NOTE — Telephone Encounter (Signed)
 Left message for the patient's husband Oneil to call back. to call back.

## 2024-07-22 NOTE — Telephone Encounter (Signed)
 Pt's husband called to let Dr. Monetta know pt is sick with Covid in the hospital and was possibly contagious when she was at her appt on 8/7. The CT Scan was rescheduled to 8/18.

## 2024-07-22 NOTE — Evaluation (Signed)
 Clinical/Bedside Swallow Evaluation Patient Details  Name: Debra Galvan MRN: [id number] Date of Birth: 07/27/1946  Today's Date: 07/22/2024 Time: SLP Start Time (ACUTE ONLY): 1555 SLP Stop Time (ACUTE ONLY): 1612 SLP Time Calculation (min) (ACUTE ONLY): 17 min  Past Medical History:  Past Medical History:  Diagnosis Date   Allergy    Anemia    Arthritis    shoulder   Asthma    Cancer (HCC)    skin cancer   Cataract    small forming    Colitis 03/08/2021   Diabetes mellitus (HCC)    Diverticulosis    Essential hypertension 03/08/2021   GERD (gastroesophageal reflux disease)    Gout 07/13/2024   Heart murmur    Hyperlipidemia    Hypertension    Leukocytosis 03/08/2021   Mild intermittent asthma 12/25/2019   Mixed hyperlipidemia 12/26/2019   Osteoporosis    Subclinical hypothyroidism 07/13/2024   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  She was put on CPAP, given magnesium  sulfate, 1 mg of epinephrine IM and a 5 mg albuterol  neb.  She was also just diagnosed with COVID-19 yesterday. SLP swallow evaluation was ordered on 8/11 secondary to patient's complaints of pain/soreness when  swallowing.    Assessment / Plan / Recommendation  Clinical Impression  Patient did not present with clinical s/s of dysphagia as per this bedside swallow evaluation. Although she does have complaint of pain when swallowing, her primary complaint is her voice which is hoarse and her report of a lisp with her speech (SLP did not detect this in connected speech). She indicated that her voice issues and discomfort with swallowing have been ongoing for many months. She stated that she has had instances of feeling that her throat is closing up and she cannot breathe or speak and this is always precipitated by a stressor. 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 Arlene). SLP is recommending patient f/u with an OP ENT and if swallowing difficulties worsen or do not improve, could consider an OP MBS. SLP Visit Diagnosis: Dysphagia, unspecified (R13.10)    Aspiration Risk  No limitations    Diet Recommendation Regular;Thin liquid    Liquid Administration via: Cup;Straw Medication Administration: Whole meds with liquid Supervision: Patient able to self feed Postural Changes: Seated upright at 90 degrees;Remain upright for at least 30 minutes after po intake    Other  Recommendations Oral Care Recommendations: Oral care BID     Assistance Recommended at Discharge    Functional Status Assessment Patient has not had a recent decline in their  functional status  Frequency and Duration     N/A       Prognosis   N/A     Swallow Study   General Date of Onset: 07/22/24 HPI: Patient is a 78 y.o. 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  She was put on CPAP, given magnesium  sulfate, 1 mg of epinephrine IM and a 5 mg albuterol  neb.  She was also just diagnosed with COVID-19 yesterday. SLP swallow evaluation was ordered on 8/11 secondary to patient's complaints of pain/soreness when swallowing. Type of Study: Bedside Swallow Evaluation Previous Swallow Assessment: none found Diet Prior to this Study: Regular;Thin liquids (Level 0) Temperature Spikes Noted: No Respiratory Status: Room air History of Recent Intubation: No Behavior/Cognition: Alert;Cooperative;Pleasant mood Oral Cavity Assessment: Within Functional Limits Oral Care Completed by SLP: No Oral Cavity - Dentition: Adequate natural dentition Vision: Functional for self-feeding Self-Feeding Abilities: Able to feed self Patient Positioning: Upright in bed Baseline Vocal Quality: Hoarse Volitional Swallow: Able to elicit    Oral/Motor/Sensory Function Overall Oral Motor/Sensory Function: Within functional limits   Ice Chips     Thin Liquid Thin Liquid: Within functional limits Presentation: Straw;Self Fed    Nectar Thick     Honey Thick     Puree Puree: Not tested   Solid     Solid: Not tested     Norleen IVAR Blase, MA, CCC-SLP Speech Therapy

## 2024-07-22 NOTE — Progress Notes (Signed)
*  PRELIMINARY RESULTS* Echocardiogram 2D Echocardiogram has been performed.  Debra Galvan Stallion 07/22/2024, 2:19 PM

## 2024-07-22 NOTE — Telephone Encounter (Signed)
 The patient's husband Oneil returned our call and I informed him that Dr. Monetta recommended to cancel the procedure and rescheduling it when the patient was over her Covid infection. Mark informed us  that he had rescheduled the test to August 18th. He really did not want to cancel the test and asked if it would be ok for her to have the test on 8/18 if the patient was feeling better? Please advise.

## 2024-07-22 NOTE — Progress Notes (Signed)
 Heart Failure Navigator Progress Note  Assessed for Heart & Vascular TOC clinic readiness.  Patient does not meet criteria due to last EF 60-65%, .Patient admitted  with complaints of acute respiratory failure after eating fish. Per MD will follow up with PCP in 1-2 weeks and [facility] on 09/05/2024. No HF TOC.   Navigator will sign off at this time.   Stephane Haddock, BSN, Scientist, clinical (histocompatibility and immunogenetics) Only

## 2024-07-23 LAB — HEMOGLOBIN A1C
Hgb A1c MFr Bld: 6.9 % — ABNORMAL HIGH (ref 4.8–5.6)
Mean Plasma Glucose: 151 mg/dL

## 2024-07-24 ENCOUNTER — Ambulatory Visit (HOSPITAL_COMMUNITY)

## 2024-07-24 ENCOUNTER — Encounter (HOSPITAL_COMMUNITY): Payer: Self-pay

## 2024-07-24 NOTE — Telephone Encounter (Signed)
 Verified with Laymon Quivers that this patient had been rescheduled per Dr. Leandrew recommendation.

## 2024-07-24 NOTE — Telephone Encounter (Signed)
 Patient has been made aware of the new date for her cardiac CT

## 2024-07-29 ENCOUNTER — Ambulatory Visit (HOSPITAL_COMMUNITY)

## 2024-08-02 ENCOUNTER — Encounter: Payer: Self-pay | Admitting: Cardiology

## 2024-08-02 ENCOUNTER — Ambulatory Visit: Attending: Cardiology | Admitting: Cardiology

## 2024-08-02 VITALS — BP 120/60 | HR 72 | Ht 64.0 in | Wt 161.0 lb

## 2024-08-02 DIAGNOSIS — I110 Hypertensive heart disease with heart failure: Secondary | ICD-10-CM | POA: Diagnosis not present

## 2024-08-02 DIAGNOSIS — R0602 Shortness of breath: Secondary | ICD-10-CM | POA: Diagnosis not present

## 2024-08-02 DIAGNOSIS — I10 Essential (primary) hypertension: Secondary | ICD-10-CM | POA: Diagnosis not present

## 2024-08-02 DIAGNOSIS — E1122 Type 2 diabetes mellitus with diabetic chronic kidney disease: Secondary | ICD-10-CM | POA: Diagnosis not present

## 2024-08-02 DIAGNOSIS — E782 Mixed hyperlipidemia: Secondary | ICD-10-CM

## 2024-08-02 NOTE — Patient Instructions (Addendum)

## 2024-08-02 NOTE — Progress Notes (Signed)
 Cardiology Office Note:    Date:  08/05/2024   ID:  Debra  KEZIAH Galvan, DOB 04-12-1946, MRN [id number]  PCP:  Stephane Leita DEL, MD  Cardiologist:  Dub Huntsman, DO  Electrophysiologist:  None   Referring MD: Stephane Leita DEL, MD    I am ok  History of Present Illness:    Debra  ADDISYN Galvan is a 78 y.o. female with a hx of hypertensive heart disease with heart failure, Coronary artery calcification, Type 2 Diabetes Mellitus, Aortic atherosclerosis.   Since her visit with Dr. Monetta she was seen in [facility], ED for hypoxia she was admitted was noted to have mildly elevated troponin which was suspected to be in the setting of COVID.  She have a nonspecific complaint of chest pain and shortness of breath today.  She tells me that she is experiencing difficulty swallowing she has referral to GI and ENT which she has not made yet.  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   albuterol  (VENTOLIN  HFA) 108 (90 Base) MCG/ACT inhaler Inhale  1 puff into the lungs every 6 (six) hours as needed for wheezing or shortness of breath.   atenolol -chlorthalidone  (TENORETIC ) 100-25 MG per tablet Take 1 tablet by mouth daily.   calcium -vitamin D (OSCAL WITH D) 250-125 MG-UNIT tablet Take 1 tablet by mouth daily.   [employer] Lancets Micro Thin 33G MISC daily. for testing   ergocalciferol (VITAMIN D2) 1.25 MG (50000 UT) capsule Take 50,000 Units by mouth every Saturday.   ezetimibe  (ZETIA ) 10 MG tablet Take 10 mg by mouth daily.   furosemide  (LASIX ) 20 MG tablet Take 1 tablet (20 mg total) by mouth daily.   naproxen sodium (ALEVE) 220 MG tablet Take 220 mg by mouth 2 (two) times daily as needed (for pain).   TRUETRACK TEST test strip CHECK DAILY BLOOD SUGAR     Allergies:   Monosodium glutamate, Atorvastatin, Sulfa antibiotics, and Prednisone   Social History   Socioeconomic History   Marital status: Married    Spouse name: Not on file   Number of children: 3   Years of education: Not on file   Highest education level: Not on file  Occupational History   Not on file  Tobacco Use   Smoking status: Never   Smokeless tobacco: Never  Substance and Sexual Activity  Alcohol use: Yes    Alcohol/week: 0.0 standard drinks of alcohol    Comment: 1 glass a month - rare    Drug use: Never   Sexual activity: Not on file  Other Topics Concern   Not on file  Social History Narrative   Not on file   Social Drivers of Health   Financial Resource Strain: Not on file  Food Insecurity: No Food Insecurity (07/21/2024)   Hunger Vital Sign    Worried About Running Out of Food in the Last Year: Never true    Ran Out of Food in the Last Year: Never true  Transportation Needs: No Transportation Needs (07/21/2024)   PRAPARE - Administrator, Civil Service (Medical): No    Lack of Transportation (Non-Medical): No  Physical Activity: Not on file  Stress: Not on file  Social Connections: Unknown (07/21/2024)   Social Connection and Isolation  Panel    Frequency of Communication with Friends and Family: Patient declined    Frequency of Social Gatherings with Friends and Family: Patient declined    Attends Religious Services: Never    Diplomatic Services operational officer: Patient declined    Attends Engineer, structural: Patient declined    Marital Status: Married     Family History: The patient's family history includes Colon cancer in her maternal grandfather; Heart disease in her father; Hypertension in her father; Stroke in her father and mother. There is no history of Breast cancer, Colon polyps, Esophageal cancer, Stomach cancer, Rectal cancer, or BRCA 1/2.  ROS:   Review of Systems  Constitution: Negative for decreased appetite, fever and weight gain.  HENT: Negative for congestion, ear discharge, hoarse voice and sore throat.   Eyes: Negative for discharge, redness, vision loss in right eye and visual halos.  Cardiovascular: Negative for chest pain, dyspnea on exertion, leg swelling, orthopnea and palpitations.  Respiratory: Negative for cough, hemoptysis, shortness of breath and snoring.   Endocrine: Negative for heat intolerance and polyphagia.  Hematologic/Lymphatic: Negative for bleeding problem. Does not bruise/bleed easily.  Skin: Negative for flushing, nail changes, rash and suspicious lesions.  Musculoskeletal: Negative for arthritis, joint pain, muscle cramps, myalgias, neck pain and stiffness.  Gastrointestinal: Negative for abdominal pain, bowel incontinence, diarrhea and excessive appetite.  Genitourinary: Negative for decreased libido, genital sores and incomplete emptying.  Neurological: Negative for brief paralysis, focal weakness, headaches and loss of balance.  Psychiatric/Behavioral: Negative for altered mental status, depression and suicidal ideas.  Allergic/Immunologic: Negative for HIV exposure and persistent infections.    EKGs/Labs/Other Studies Reviewed:    The following studies  were reviewed today:   EKG:  The ekg ordered today demonstrates   Recent Labs: 07/21/2024: B Natriuretic Peptide 202.6; Magnesium  2.6 07/22/2024: ALT 25; BUN 39; Creatinine, Ser 1.31; Hemoglobin 13.6; Platelets 249; Potassium 4.1; Sodium 134  Recent Lipid Panel No results found for: CHOL, TRIG, HDL, CHOLHDL, VLDL, LDLCALC, LDLDIRECT  Physical Exam:    VS:  BP 120/60 (BP Location: Left Arm, Patient Position: Sitting, Cuff Size: Normal)   Pulse 72   Ht 5' 4 (1.626 m)   Wt 161 lb (73 kg)   SpO2 97%   BMI 27.64 kg/m     Wt Readings from Last 3 Encounters:  08/02/24 161 lb (73 kg)  07/22/24 165 lb 12.6 oz (75.2 kg)  07/18/24 169 lb 3.2 oz (76.7 kg)     GEN: Well nourished, well developed in no acute distress HEENT: Normal NECK: No  JVD; No carotid bruits LYMPHATICS: No lymphadenopathy CARDIAC: S1S2 noted,RRR, no murmurs, rubs, gallops RESPIRATORY:  Clear to auscultation without rales, wheezing or rhonchi  ABDOMEN: Soft, non-tender, non-distended, +bowel sounds, no guarding. EXTREMITIES: No edema, No cyanosis, no clubbing MUSCULOSKELETAL:  No deformity  SKIN: Warm and dry NEUROLOGIC:  Alert and oriented x 3, non-focal PSYCHIATRIC:  Normal affect, good insight  ASSESSMENT:    1. Hypertensive heart disease with heart failure (HCC)   2. Essential hypertension   3. Shortness of breath   4. Type 2 diabetes mellitus with diabetic chronic kidney disease, unspecified CKD stage, unspecified whether long term insulin  use (HCC)   5. Mixed hyperlipidemia    PLAN:    1.  Hypertension-she is off losartan  blood pressure is at target and in the office today 1.  We will need to discuss further use of very low-dose losartan  giving her diabetes she was on 100 mg daily.  Hopefully by her next visit we will be able to put her on at least 25 mg daily in the setting of her diabetes and CKD. 2.  Shortness of breath and abnormal EKG coronary CTA pending I have also asked the patient  to notify my office when she get her testing done. 3.  Diabetes type 2-managed by her PCP hemoglobin A1c 6.9 4.  Hyperlipidemia-continue current lipid-lowering medications  The patient is in agreement with the above plan. The patient left the office in stable condition.  The patient will follow up in   Medication Adjustments/Labs and Tests Ordered: Current medicines are reviewed at length with the patient today.  Concerns regarding medicines are outlined above.  No orders of the defined types were placed in this encounter.  No orders of the defined types were placed in this encounter.   Patient Instructions  Medication Instructions:  Your physician recommends that you continue on your current medications as directed. Please refer to the Current Medication list given to you today.  *If you need a refill on your cardiac medications before your next appointment, please call your pharmacy*   Follow-Up: At [facility], you and your health needs are our priority.  As part of our continuing mission to provide you with exceptional heart care, our providers are all part of one team.  This team includes your primary Cardiologist (physician) and Advanced Practice Providers or APPs (Physician Assistants and Nurse Practitioners) who all work together to provide you with the care you need, when you need it.  Your next appointment:   9 month(s)  Provider:   Keagen Heinlen, DO       Adopting a Healthy Lifestyle.  Know what a healthy weight is for you (roughly BMI <25) and aim to maintain this   Aim for 7+ servings of fruits and vegetables daily   65-80+ fluid ounces of water or unsweet tea for healthy kidneys   Limit to max 1 drink of alcohol per day; avoid smoking/tobacco   Limit animal fats in diet for cholesterol and heart health - choose grass fed whenever available   Avoid highly processed foods, and foods high in saturated/trans fats   Aim for low stress - take time to  unwind and care for your mental health   Aim for 150 min of moderate intensity exercise weekly for heart health, and weights twice weekly for bone health   Aim for 7-9 hours of sleep daily   When it comes to diets, agreement about the perfect plan isnt easy to find, even among  the experts. Experts at the [facility] [employer] developed an idea known as the Healthy Eating Plate. Just imagine a plate divided into logical, healthy portions.   The emphasis is on diet quality:   Load up on vegetables and fruits - one-half of your plate: Aim for color and variety, and remember that potatoes dont count.   Go for whole grains - one-quarter of your plate: Whole wheat, barley, wheat berries, quinoa, oats, brown rice, and foods made with them. If you want pasta, go with whole wheat pasta.   Protein power - one-quarter of your plate: Fish, chicken, beans, and nuts are all healthy, versatile protein sources. Limit red meat.   The diet, however, does go beyond the plate, offering a few other suggestions.   Use healthy plant oils, such as olive, canola, soy, corn, sunflower and peanut. Check the labels, and avoid partially hydrogenated oil, which have unhealthy trans fats.   If youre thirsty, drink water. Coffee and tea are good in moderation, but skip sugary drinks and limit milk and dairy products to one or two daily servings.   The type of carbohydrate in the diet is more important than the amount. Some sources of carbohydrates, such as vegetables, fruits, whole grains, and beans-are healthier than others.   Finally, stay active  Signed, Tyrick Dunagan, DO  08/05/2024 4:33 PM    [facility]

## 2024-08-05 ENCOUNTER — Ambulatory Visit (HOSPITAL_COMMUNITY)
Admission: RE | Admit: 2024-08-05 | Discharge: 2024-08-05 | Disposition: A | Discharge status: Home / Self Care | Source: Ambulatory Visit | Attending: Cardiology | Admitting: Cardiology

## 2024-08-05 ENCOUNTER — Encounter: Payer: Self-pay | Admitting: Cardiology

## 2024-08-05 DIAGNOSIS — I509 Heart failure, unspecified: Secondary | ICD-10-CM | POA: Diagnosis not present

## 2024-08-05 DIAGNOSIS — I2510 Atherosclerotic heart disease of native coronary artery without angina pectoris: Secondary | ICD-10-CM | POA: Diagnosis not present

## 2024-08-05 DIAGNOSIS — E1169 Type 2 diabetes mellitus with other specified complication: Secondary | ICD-10-CM | POA: Insufficient documentation

## 2024-08-05 DIAGNOSIS — J4520 Mild intermittent asthma, uncomplicated: Secondary | ICD-10-CM | POA: Diagnosis not present

## 2024-08-05 DIAGNOSIS — I110 Hypertensive heart disease with heart failure: Secondary | ICD-10-CM | POA: Diagnosis not present

## 2024-08-05 DIAGNOSIS — E669 Obesity, unspecified: Secondary | ICD-10-CM | POA: Insufficient documentation

## 2024-08-05 DIAGNOSIS — R931 Abnormal findings on diagnostic imaging of heart and coronary circulation: Secondary | ICD-10-CM | POA: Diagnosis not present

## 2024-08-05 DIAGNOSIS — I700 Atherosclerosis of aorta: Secondary | ICD-10-CM | POA: Insufficient documentation

## 2024-08-05 DIAGNOSIS — E782 Mixed hyperlipidemia: Secondary | ICD-10-CM | POA: Insufficient documentation

## 2024-08-05 MED ORDER — NITROGLYCERIN 0.4 MG SL SUBL
0.8000 mg | SUBLINGUAL_TABLET | Freq: Once | SUBLINGUAL | Status: AC
  Administered 2024-08-05: 0.8 mg via SUBLINGUAL

## 2024-08-05 MED ORDER — IOHEXOL 350 MG/ML SOLN
100.0000 mL | Freq: Once | INTRAVENOUS | Status: AC | PRN
  Administered 2024-08-05: 100 mL via INTRAVENOUS

## 2024-08-08 ENCOUNTER — Encounter: Payer: Self-pay | Admitting: Cardiology

## 2024-08-14 ENCOUNTER — Encounter: Payer: Self-pay | Admitting: Cardiology

## 2024-08-14 DIAGNOSIS — Z79899 Other long term (current) drug therapy: Secondary | ICD-10-CM

## 2024-08-14 DIAGNOSIS — I10 Essential (primary) hypertension: Secondary | ICD-10-CM

## 2024-08-14 DIAGNOSIS — R0602 Shortness of breath: Secondary | ICD-10-CM

## 2024-08-15 ENCOUNTER — Ambulatory Visit: Admitting: Cardiovascular Disease

## 2024-08-19 MED ORDER — POTASSIUM CHLORIDE ER 10 MEQ PO TBCR: 10.0000 meq | EXTENDED_RELEASE_TABLET | Freq: Every day | ORAL | 1 refills | Status: AC

## 2024-08-21 ENCOUNTER — Ambulatory Visit (HOSPITAL_COMMUNITY)

## 2024-08-23 LAB — BASIC METABOLIC PANEL WITH GFR
BUN/Creatinine Ratio: 19 (ref 12–28)
BUN: 22 mg/dL (ref 8–27)
CO2: 29 mmol/L (ref 20–29)
Calcium: 9.6 mg/dL (ref 8.7–10.3)
Chloride: 94 mmol/L — ABNORMAL LOW (ref 96–106)
Creatinine, Ser: 1.15 mg/dL — ABNORMAL HIGH (ref 0.57–1.00)
Glucose: 101 mg/dL — ABNORMAL HIGH (ref 70–99)
Potassium: 3.4 mmol/L — ABNORMAL LOW (ref 3.5–5.2)
Sodium: 140 mmol/L (ref 134–144)
eGFR: 49 mL/min/1.73 — ABNORMAL LOW (ref >59)

## 2024-09-05 ENCOUNTER — Ambulatory Visit: Admitting: Cardiology

## 2024-09-24 ENCOUNTER — Ambulatory Visit: Payer: Self-pay | Admitting: Cardiology

## 2024-10-07 ENCOUNTER — Ambulatory Visit (INDEPENDENT_AMBULATORY_CARE_PROVIDER_SITE_OTHER): Admitting: Internal Medicine

## 2024-10-07 ENCOUNTER — Encounter: Payer: Self-pay | Admitting: Internal Medicine

## 2024-10-07 VITALS — BP 132/74 | HR 60 | Temp 98.0°F | Ht 64.0 in | Wt 167.4 lb

## 2024-10-07 DIAGNOSIS — J383 Other diseases of vocal cords: Secondary | ICD-10-CM | POA: Insufficient documentation

## 2024-10-07 MED ORDER — PANTOPRAZOLE SODIUM 40 MG PO TBEC: 40.0000 mg | DELAYED_RELEASE_TABLET | Freq: Every day | ORAL | 2 refills | Status: DC

## 2024-10-07 MED ORDER — FAMOTIDINE 20 MG PO TABS: ORAL_TABLET | ORAL | 11 refills | Status: AC

## 2024-10-07 NOTE — Progress Notes (Signed)
 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     History of Present Illness  10/07/2024  Pulmonary/ 1st office eval/Waino Mounsey  Chief Complaint  Patient presents with   Consult    Referred by Kardie Tobb, DO. She states she was dx with Asthma age 78. She has had increased SOB off and on since Aug 2025.  She gets winded with walking up stairs and when she is exposed to strong perfumes.   Dyspnea:  no real change in doe  = inclines or steps/ walks shopping fine all day Cough: still coughing some esp p stirs ? After coffee Sleep: bed is flat bed one wedge mattress  occ wakes with overt gerd but not cough SABA use: every other day  02 ldz:wnwz     No obvious day to day or daytime pattern/variability or assoc excess/ purulent sputum or mucus plugs or hemoptysis or cp or chest tightness, subjective wheeze or overt  r hb symptoms.    Also denies any obvious fluctuation of symptoms with weather or environmental changes or other aggravating or alleviating factors except as outlined above   No unusual exposure hx or h/o childhood pna/ asthma or knowledge of premature birth.  Current Allergies, Complete Past Medical History, Past Surgical History, Family History, and Social History were reviewed in [employer] record.  ROS  The following are not active complaints unless bolded Hoarseness, sore throat, dysphagia/globus , dental problems, itching,  sneezing,  nasal congestion or discharge of excess mucus/watery  or purulent secretions, ear ache,   fever, chills, sweats, unintended wt loss or wt gain, classically pleuritic or exertional cp,  orthopnea pnd or arm/hand swelling  or leg swelling, presyncope, palpitations, abdominal pain, anorexia, nausea, vomiting, diarrhea  or change in bowel habits or change in bladder habits, change in stools or change in urine, dysuria, hematuria,  rash, arthralgias, visual complaints, headache, numbness, weakness or ataxia or problems with walking or coordination,  change in mood or  memory.             Outpatient Medications Prior to Visit  Medication Sig Dispense Refill   acetaminophen  (TYLENOL ) 500 MG tablet Take 500 mg by mouth every 6 (six) hours as needed (for pain).     albuterol  (VENTOLIN  HFA) 108 (90 Base) MCG/ACT inhaler Inhale 1 puff into the lungs every 6 (six) hours as needed for wheezing or shortness of breath.     atenolol -chlorthalidone  (TENORETIC ) 100-25 MG per tablet Take 1 tablet by mouth daily.     calcium -vitamin D (OSCAL WITH D) 250-125 MG-UNIT tablet Take 1 tablet by mouth daily.     [employer] Lancets Micro Thin 33G MISC daily. for testing     ergocalciferol (VITAMIN D2) 1.25 MG (50000 UT) capsule Take 50,000 Units by mouth every Saturday.     ezetimibe  (ZETIA ) 10 MG tablet Take 10 mg by  mouth daily.     furosemide  (LASIX ) 20 MG tablet Take 1 tablet (20 mg total) by mouth daily. 90 tablet 3   naproxen sodium (ALEVE) 220 MG tablet Take 220 mg by mouth 2 (two) times daily as needed (for pain).     potassium chloride  (KLOR-CON ) 10 MEQ tablet Take 1 tablet (10 mEq total) by mouth daily. 90 tablet 1   TRUETRACK TEST test strip CHECK DAILY BLOOD SUGAR     losartan  (COZAAR ) 100 MG tablet Take 100 mg by mouth daily. (Patient not taking: Reported on 08/02/2024)     No facility-administered medications prior to visit.    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   Type 2 diabetes mellitus with obesity 03/08/2021   IMO SNOMED Dx Update Oct 2024        Objective:     BP 132/74   Pulse 60   Temp 98 F (36.7 C) (Oral)   Ht 5' 4 (1.626 m) Comment: per pt  Wt 167 lb 6.4 oz (75.9 kg)   SpO2 98% Comment: on RA  BMI 28.73 kg/m   SpO2: 98 % (on RA)  pleasantly anxious amb wf  slight voice fatigue    HEENT : Oropharynx  clear      Nasal turbinates nl    NECK :  without  apparent JVD/ palpable Nodes/TM    LUNGS: no acc muscle use,  Nl contour chest which is clear to A and P bilaterally without cough on insp or exp maneuvers   CV: Slt irreg c/w PACs (on prior EKG)  but  no s3 or murmur or increase in P2, and no edema   ABD:  soft and nontender   MS:  Gait nl   ext warm without deformities Or obvious joint restrictions  calf tenderness, cyanosis or clubbing    SKIN: warm and dry without lesions    NEURO:  alert, approp, nl sensorium with  no motor or cerebellar deficits apparent.      CTa 07/13/24 c/w mild pulmonary edema > cleared on f/u CT coronary 08/05/24 with [facility] noted    Assessment   Assessment & Plan Vocal cord dysfunction Onset ? In her 20's brought on by stress or heavy perfumes/ seasoning  - Admit 07/2024 with typical spell with pulmonary edema on CTa  07/13/24 cleared on f/u CT coronary 08/05/24 with HH noted  with neg cards w/u  - rec 10/07/2024 max gerd rx/ ST eval next step if not improved    The hx of recent spells of layngospasm is typical of VCD  - never present noct, brought on by strong smell or stress and not responsive to saba and in this case may be related also to LPR and complicated by neg pressure edema/ which can be life threatening   >>> rec  max gerd rx and judicious use of saba given tendency go atrial arrythmia and this is not really asthma  - The proper method of use, as well as anticipated side effects, of a metered-dose inhaler were discussed and demonstrated to the patient using teach back method. Improved effectiveness after extensive coaching during this visit to a level of approximately 60%  % from a baseline of 30% with hfa  and very short Ti.         Each maintenance medication was reviewed in detail including emphasizing most importantly the difference between maintenance and prns and under what circumstances the prns are to be triggered using an action plan format where appropriate.  Total time for H and P, chart review, counseling, reviewing hfa device(s) and generating customized AVS unique to this office visit / same day charting = 62 min   for  chronic  refractory respiratory  symptoms of uncertain etiology          AVS  Patient Instructions  You have classic  vocal cord dysfunction  - Speech therapy would be next step if not responding ;  Lupita Connor, SLP  [facility]  [address]  [phone]   Pantoprazole (protonix) 40 mg   Take  30-60 min before first meal of the day and Pepcid (famotidine)  20 mg after supper until return to office - this is the best way to tell whether stomach acid (LPR)  is contributing to your problem.    GERD (REFLUX)  is an extremely common cause of respiratory symptoms just like yours , many times with no obvious heartburn at all.    It can be treated with medication, but also with lifestyle changes including elevation of the head of your bed (ideally with 6 -8inch blocks under  the headboard of your bed),  Smoking cessation, avoidance of late meals, excessive alcohol, and avoid fatty foods, chocolate, peppermint, colas, red wine, and acidic juices such as orange juice.  NO MINT OR MENTHOL PRODUCTS SO NO COUGH DROPS   USE SUGARLESS CANDY INSTEAD (Jolley ranchers or Stover's or Life Savers) or even ice chips will also do - the key is to swallow to prevent all throat clearing. NO OIL BASED VITAMINS - use powdered substitutes.  Avoid fish oil when coughing.    Please schedule a follow up visit in 3 months but call sooner if needed        Ozell America, MD 10/07/2024

## 2024-10-07 NOTE — Patient Instructions (Addendum)
 You have classic  vocal cord dysfunction  - Speech therapy would be next step if not responding ;  Lupita Connor, SLP  [facility]  [address]  [phone]   Pantoprazole (protonix) 40 mg   Take  30-60 min before first meal of the day and Pepcid (famotidine)  20 mg after supper until return to office - this is the best way to tell whether stomach acid (LPR)  is contributing to your problem.    GERD (REFLUX)  is an extremely common cause of respiratory symptoms just like yours , many times with no obvious heartburn at all.    It can be treated with medication, but also with lifestyle changes including elevation of the head of your bed (ideally with 6 -8inch blocks under the headboard of your bed),  Smoking cessation, avoidance of late meals, excessive alcohol, and avoid fatty foods, chocolate, peppermint, colas, red wine, and acidic juices such as orange juice.  NO MINT OR MENTHOL PRODUCTS SO NO COUGH DROPS   USE SUGARLESS CANDY INSTEAD (Jolley ranchers or Stover's or Life Savers) or even ice chips will also do - the key is to swallow to prevent all throat clearing. NO OIL BASED VITAMINS - use powdered substitutes.  Avoid fish oil when coughing.    Please schedule a follow up visit in 3 months but call sooner if needed

## 2024-10-07 NOTE — Assessment & Plan Note (Addendum)
 Onset ? In her 20's brought on by stress or heavy perfumes/ seasoning  - Admit 07/2024 with typical spell with pulmonary edema on CTa  07/13/24 cleared on f/u CT coronary 08/05/24 with HH noted  with neg cards w/u  - rec 10/07/2024 max gerd rx/ ST eval next step if not improved   The hx of recent spells of layngospasm is typical of VCD  - never present noct, brought on by strong smell or stress and not responsive to saba and in this case may be related also to LPR and complicated by neg pressure edema/ which can be life threatening   >>> rec max gerd rx and judicious use of saba given tendency go atrial arrythmia and this is not really asthma  - The proper method of use, as well as anticipated side effects, of a metered-dose inhaler were discussed and demonstrated to the patient using teach back method. Improved effectiveness after extensive coaching during this visit to a level of approximately 60%  % from a baseline of 30% with hfa  and very short Ti.         Each maintenance medication was reviewed in detail including emphasizing most importantly the difference between maintenance and prns and under what circumstances the prns are to be triggered using an action plan format where appropriate.  Total time for H and P, chart review, counseling, reviewing hfa device(s) and generating customized AVS unique to this office visit / same day charting = 62 min   for  chronic  refractory respiratory  symptoms of uncertain etiology

## 2024-11-11 ENCOUNTER — Other Ambulatory Visit: Payer: Self-pay | Admitting: Internal Medicine

## 2024-11-22 ENCOUNTER — Emergency Department (HOSPITAL_COMMUNITY)

## 2024-11-22 ENCOUNTER — Inpatient Hospital Stay (HOSPITAL_COMMUNITY)

## 2024-11-22 ENCOUNTER — Inpatient Hospital Stay (HOSPITAL_COMMUNITY)
Admission: EM | Admit: 2024-11-22 | Discharge: 2024-11-27 | DRG: 871 | Disposition: A | Attending: Pulmonary Disease | Admitting: Pulmonary Disease

## 2024-11-22 ENCOUNTER — Encounter (HOSPITAL_COMMUNITY): Payer: Self-pay | Admitting: Emergency Medicine

## 2024-11-22 DIAGNOSIS — Z90710 Acquired absence of both cervix and uterus: Secondary | ICD-10-CM

## 2024-11-22 DIAGNOSIS — E1122 Type 2 diabetes mellitus with diabetic chronic kidney disease: Secondary | ICD-10-CM | POA: Diagnosis present

## 2024-11-22 DIAGNOSIS — R6521 Severe sepsis with septic shock: Secondary | ICD-10-CM | POA: Diagnosis present

## 2024-11-22 DIAGNOSIS — Z79899 Other long term (current) drug therapy: Secondary | ICD-10-CM

## 2024-11-22 DIAGNOSIS — E119 Type 2 diabetes mellitus without complications: Secondary | ICD-10-CM

## 2024-11-22 DIAGNOSIS — I428 Other cardiomyopathies: Secondary | ICD-10-CM | POA: Diagnosis not present

## 2024-11-22 DIAGNOSIS — N1831 Chronic kidney disease, stage 3a: Secondary | ICD-10-CM | POA: Insufficient documentation

## 2024-11-22 DIAGNOSIS — E1165 Type 2 diabetes mellitus with hyperglycemia: Secondary | ICD-10-CM | POA: Diagnosis present

## 2024-11-22 DIAGNOSIS — I480 Paroxysmal atrial fibrillation: Secondary | ICD-10-CM | POA: Diagnosis not present

## 2024-11-22 DIAGNOSIS — J385 Laryngeal spasm: Secondary | ICD-10-CM | POA: Diagnosis present

## 2024-11-22 DIAGNOSIS — E782 Mixed hyperlipidemia: Secondary | ICD-10-CM | POA: Diagnosis present

## 2024-11-22 DIAGNOSIS — M810 Age-related osteoporosis without current pathological fracture: Secondary | ICD-10-CM | POA: Diagnosis present

## 2024-11-22 DIAGNOSIS — K219 Gastro-esophageal reflux disease without esophagitis: Secondary | ICD-10-CM | POA: Diagnosis present

## 2024-11-22 DIAGNOSIS — Z882 Allergy status to sulfonamides status: Secondary | ICD-10-CM

## 2024-11-22 DIAGNOSIS — J45909 Unspecified asthma, uncomplicated: Secondary | ICD-10-CM | POA: Diagnosis present

## 2024-11-22 DIAGNOSIS — Z9181 History of falling: Secondary | ICD-10-CM

## 2024-11-22 DIAGNOSIS — Z825 Family history of asthma and other chronic lower respiratory diseases: Secondary | ICD-10-CM

## 2024-11-22 DIAGNOSIS — E785 Hyperlipidemia, unspecified: Secondary | ICD-10-CM | POA: Diagnosis present

## 2024-11-22 DIAGNOSIS — R7989 Other specified abnormal findings of blood chemistry: Secondary | ICD-10-CM | POA: Diagnosis present

## 2024-11-22 DIAGNOSIS — I130 Hypertensive heart and chronic kidney disease with heart failure and stage 1 through stage 4 chronic kidney disease, or unspecified chronic kidney disease: Secondary | ICD-10-CM | POA: Diagnosis present

## 2024-11-22 DIAGNOSIS — I2489 Other forms of acute ischemic heart disease: Secondary | ICD-10-CM | POA: Diagnosis present

## 2024-11-22 DIAGNOSIS — A419 Sepsis, unspecified organism: Secondary | ICD-10-CM

## 2024-11-22 DIAGNOSIS — J9602 Acute respiratory failure with hypercapnia: Secondary | ICD-10-CM | POA: Diagnosis present

## 2024-11-22 DIAGNOSIS — J9811 Atelectasis: Secondary | ICD-10-CM | POA: Diagnosis present

## 2024-11-22 DIAGNOSIS — R1312 Dysphagia, oropharyngeal phase: Secondary | ICD-10-CM | POA: Diagnosis present

## 2024-11-22 DIAGNOSIS — I5033 Acute on chronic diastolic (congestive) heart failure: Secondary | ICD-10-CM | POA: Diagnosis not present

## 2024-11-22 DIAGNOSIS — E876 Hypokalemia: Secondary | ICD-10-CM | POA: Diagnosis present

## 2024-11-22 DIAGNOSIS — Z8249 Family history of ischemic heart disease and other diseases of the circulatory system: Secondary | ICD-10-CM

## 2024-11-22 DIAGNOSIS — Z800 Family history of malignant neoplasm of digestive organs: Secondary | ICD-10-CM

## 2024-11-22 DIAGNOSIS — J189 Pneumonia, unspecified organism: Secondary | ICD-10-CM | POA: Diagnosis present

## 2024-11-22 DIAGNOSIS — Z9049 Acquired absence of other specified parts of digestive tract: Secondary | ICD-10-CM

## 2024-11-22 DIAGNOSIS — Z9102 Food additives allergy status: Secondary | ICD-10-CM

## 2024-11-22 DIAGNOSIS — E872 Acidosis, unspecified: Secondary | ICD-10-CM | POA: Diagnosis present

## 2024-11-22 DIAGNOSIS — Z85828 Personal history of other malignant neoplasm of skin: Secondary | ICD-10-CM

## 2024-11-22 DIAGNOSIS — J9601 Acute respiratory failure with hypoxia: Secondary | ICD-10-CM | POA: Diagnosis present

## 2024-11-22 DIAGNOSIS — Z888 Allergy status to other drugs, medicaments and biological substances status: Secondary | ICD-10-CM

## 2024-11-22 DIAGNOSIS — I10 Essential (primary) hypertension: Secondary | ICD-10-CM | POA: Diagnosis present

## 2024-11-22 DIAGNOSIS — Z8719 Personal history of other diseases of the digestive system: Secondary | ICD-10-CM

## 2024-11-22 LAB — I-STAT CHEM 8, ED
BUN: 29 mg/dL — ABNORMAL HIGH (ref 8–23)
Calcium, Ion: 1.12 mmol/L — ABNORMAL LOW (ref 1.15–1.40)
Chloride: 93 mmol/L — ABNORMAL LOW (ref 98–111)
Creatinine, Ser: 1.4 mg/dL — ABNORMAL HIGH (ref 0.44–1.00)
Glucose, Bld: 370 mg/dL — ABNORMAL HIGH (ref 70–99)
HCT: 42 % (ref 36.0–46.0)
Hemoglobin: 14.3 g/dL (ref 12.0–15.0)
Potassium: 2.4 mmol/L — CL (ref 3.5–5.1)
Sodium: 138 mmol/L (ref 135–145)
TCO2: 31 mmol/L (ref 22–32)

## 2024-11-22 LAB — BASIC METABOLIC PANEL WITH GFR
Anion gap: 15 (ref 5–15)
BUN: 27 mg/dL — ABNORMAL HIGH (ref 8–23)
CO2: 30 mmol/L (ref 22–32)
Calcium: 8.7 mg/dL — ABNORMAL LOW (ref 8.9–10.3)
Chloride: 94 mmol/L — ABNORMAL LOW (ref 98–111)
Creatinine, Ser: 1.34 mg/dL — ABNORMAL HIGH (ref 0.44–1.00)
GFR, Estimated: 40 mL/min — ABNORMAL LOW (ref >60)
Glucose, Bld: 382 mg/dL — ABNORMAL HIGH (ref 70–99)
Potassium: 2.6 mmol/L — CL (ref 3.5–5.1)
Sodium: 139 mmol/L (ref 135–145)

## 2024-11-22 LAB — MAGNESIUM: Magnesium: 2.2 mg/dL (ref 1.7–2.4)

## 2024-11-22 LAB — PROCALCITONIN: Procalcitonin: 0.46 ng/mL

## 2024-11-22 LAB — CBC
HCT: 41.5 % (ref 36.0–46.0)
Hemoglobin: 13.5 g/dL (ref 12.0–15.0)
MCH: 30.3 pg (ref 26.0–34.0)
MCHC: 32.5 g/dL (ref 30.0–36.0)
MCV: 93.3 fL (ref 80.0–100.0)
Platelets: 351 K/uL (ref 150–400)
RBC: 4.45 MIL/uL (ref 3.87–5.11)
RDW: 12.1 % (ref 11.5–15.5)
WBC: 18.4 K/uL — ABNORMAL HIGH (ref 4.0–10.5)
nRBC: 0 % (ref 0.0–0.2)

## 2024-11-22 LAB — BLOOD GAS, ARTERIAL
Acid-Base Excess: 0.1 mmol/L (ref 0.0–2.0)
Bicarbonate: 28.2 mmol/L — ABNORMAL HIGH (ref 20.0–28.0)
Drawn by: 23532
O2 Content: 6 L/min
O2 Saturation: 99.7 %
Patient temperature: 37
pCO2 arterial: 60 mmHg — ABNORMAL HIGH (ref 32–48)
pH, Arterial: 7.28 — ABNORMAL LOW (ref 7.35–7.45)
pO2, Arterial: 125 mmHg — ABNORMAL HIGH (ref 83–108)

## 2024-11-22 LAB — I-STAT CG4 LACTIC ACID, ED
Lactic Acid, Venous: 5 mmol/L (ref 0.5–1.9)
Lactic Acid, Venous: 3.6 mmol/L (ref 0.5–1.9)

## 2024-11-22 LAB — CBG MONITORING, ED: Glucose-Capillary: 260 mg/dL — ABNORMAL HIGH (ref 70–99)

## 2024-11-22 LAB — PRO BRAIN NATRIURETIC PEPTIDE: Pro Brain Natriuretic Peptide: 384 pg/mL — ABNORMAL HIGH (ref <300.0)

## 2024-11-22 LAB — TROPONIN T, HIGH SENSITIVITY
Troponin T High Sensitivity: 31 ng/L — ABNORMAL HIGH (ref 0–19)
Troponin T High Sensitivity: 203 ng/L (ref 0–19)

## 2024-11-22 LAB — D-DIMER, QUANTITATIVE: D-Dimer, Quant: 4.47 ug{FEU}/mL — ABNORMAL HIGH (ref 0.00–0.50)

## 2024-11-22 MED ORDER — SODIUM CHLORIDE 0.9 % IV SOLN
1.0000 g | INTRAVENOUS | Status: DC
  Administered 2024-11-22: 1 g via INTRAVENOUS
  Filled 2024-11-22: qty 10

## 2024-11-22 MED ORDER — POTASSIUM CHLORIDE 10 MEQ/100ML IV SOLN
10.0000 meq | Freq: Once | INTRAVENOUS | Status: AC
  Administered 2024-11-22: 10 meq via INTRAVENOUS
  Filled 2024-11-22: qty 100

## 2024-11-22 MED ORDER — PANTOPRAZOLE SODIUM 40 MG IV SOLR
40.0000 mg | INTRAVENOUS | Status: DC
  Administered 2024-11-23 – 2024-11-25 (×4): 40 mg via INTRAVENOUS
  Filled 2024-11-22 (×4): qty 10

## 2024-11-22 MED ORDER — ACETAMINOPHEN 325 MG PO TABS
650.0000 mg | ORAL_TABLET | Freq: Four times a day (QID) | ORAL | Status: DC | PRN
  Administered 2024-11-25 – 2024-11-27 (×2): 650 mg via ORAL
  Filled 2024-11-22 (×5): qty 2

## 2024-11-22 MED ORDER — VANCOMYCIN HCL 1500 MG/300ML IV SOLN
1500.0000 mg | INTRAVENOUS | Status: DC
  Administered 2024-11-23: 1500 mg via INTRAVENOUS
  Filled 2024-11-22: qty 300

## 2024-11-22 MED ORDER — LACTATED RINGERS IV BOLUS
1000.0000 mL | Freq: Once | INTRAVENOUS | Status: AC
  Administered 2024-11-22: 1000 mL via INTRAVENOUS

## 2024-11-22 MED ORDER — RACEPINEPHRINE HCL 2.25 % IN NEBU
0.5000 mL | INHALATION_SOLUTION | Freq: Once | RESPIRATORY_TRACT | Status: AC
  Administered 2024-11-22: 0.5 mL via RESPIRATORY_TRACT

## 2024-11-22 MED ORDER — IOHEXOL 350 MG/ML SOLN
60.0000 mL | Freq: Once | INTRAVENOUS | Status: AC | PRN
  Administered 2024-11-22: 60 mL via INTRAVENOUS

## 2024-11-22 MED ORDER — SODIUM CHLORIDE 0.9 % IV SOLN
500.0000 mg | INTRAVENOUS | Status: DC
  Administered 2024-11-22: 500 mg via INTRAVENOUS
  Filled 2024-11-22: qty 5

## 2024-11-22 MED ORDER — RACEPINEPHRINE HCL 2.25 % IN NEBU
INHALATION_SOLUTION | RESPIRATORY_TRACT | Status: AC
  Filled 2024-11-22: qty 15

## 2024-11-22 MED ORDER — ENOXAPARIN SODIUM 40 MG/0.4ML IJ SOSY
40.0000 mg | PREFILLED_SYRINGE | INTRAMUSCULAR | Status: DC
  Administered 2024-11-23 – 2024-11-27 (×5): 40 mg via SUBCUTANEOUS
  Filled 2024-11-22 (×5): qty 0.4

## 2024-11-22 MED ORDER — INSULIN ASPART 100 UNIT/ML IJ SOLN
0.0000 [IU] | Freq: Every day | INTRAMUSCULAR | Status: DC
  Administered 2024-11-22: 2 [IU] via SUBCUTANEOUS

## 2024-11-22 MED ORDER — INSULIN ASPART 100 UNIT/ML IJ SOLN
0.0000 [IU] | Freq: Three times a day (TID) | INTRAMUSCULAR | Status: DC
  Administered 2024-11-23: 2 [IU] via SUBCUTANEOUS
  Administered 2024-11-23: 1 [IU] via SUBCUTANEOUS
  Administered 2024-11-23 – 2024-11-24 (×2): 2 [IU] via SUBCUTANEOUS
  Administered 2024-11-25: 09:00:00 1 [IU] via SUBCUTANEOUS
  Administered 2024-11-25 – 2024-11-26 (×5): 2 [IU] via SUBCUTANEOUS
  Filled 2024-11-22: qty 2
  Filled 2024-11-22: qty 1
  Filled 2024-11-22 (×3): qty 2
  Filled 2024-11-22: qty 1
  Filled 2024-11-22: qty 2
  Filled 2024-11-22: qty 1
  Filled 2024-11-22: qty 3
  Filled 2024-11-22: qty 2

## 2024-11-22 MED ORDER — MAGNESIUM SULFATE 2 GM/50ML IV SOLN
2.0000 g | Freq: Once | INTRAVENOUS | Status: AC
  Administered 2024-11-22: 2 g via INTRAVENOUS
  Filled 2024-11-22: qty 50

## 2024-11-22 MED ORDER — POTASSIUM CHLORIDE CRYS ER 20 MEQ PO TBCR
60.0000 meq | EXTENDED_RELEASE_TABLET | Freq: Once | ORAL | Status: AC
  Administered 2024-11-22: 60 meq via ORAL
  Filled 2024-11-22: qty 3

## 2024-11-22 MED ORDER — EZETIMIBE 10 MG PO TABS
10.0000 mg | ORAL_TABLET | Freq: Every day | ORAL | Status: DC
  Administered 2024-11-23 – 2024-11-27 (×5): 10 mg via ORAL
  Filled 2024-11-22 (×5): qty 1

## 2024-11-22 MED ORDER — ACETAMINOPHEN 650 MG RE SUPP: 650.0000 mg | Freq: Four times a day (QID) | RECTAL | Status: DC | PRN

## 2024-11-22 MED ORDER — SODIUM CHLORIDE 0.9 % IV SOLN
2.0000 g | Freq: Two times a day (BID) | INTRAVENOUS | Status: DC
  Administered 2024-11-23 – 2024-11-26 (×7): 2 g via INTRAVENOUS
  Filled 2024-11-22 (×9): qty 12.5

## 2024-11-22 MED ORDER — FAMOTIDINE 20 MG PO TABS
20.0000 mg | ORAL_TABLET | Freq: Every day | ORAL | Status: DC
  Administered 2024-11-23: 20 mg via ORAL
  Filled 2024-11-22: qty 1

## 2024-11-22 MED ORDER — LORAZEPAM 2 MG/ML IJ SOLN
INTRAMUSCULAR | Status: AC
  Filled 2024-11-22: qty 1

## 2024-11-22 MED ORDER — LORAZEPAM 2 MG/ML IJ SOLN
0.5000 mg | Freq: Once | INTRAMUSCULAR | Status: AC
  Administered 2024-11-22: 0.5 mg via INTRAVENOUS
  Filled 2024-11-22: qty 1

## 2024-11-22 NOTE — Assessment & Plan Note (Addendum)
 78 year old presenting to ED with sudden onset respiratory distress and hypoxia to 85% on RA with EMS requiring 6L HFNC -admit to stepdown -? Vocal cord dysfunction. This is her 3rd event and she had a stressful day with her cat dying. Acute event, just happened all of a sudden.  -CXR with cardiomegaly with heterogenous perihilar interstitial and ground glass opacity, may represent edema, pneumonia or hemorrhage. Suspicion of trace pleural effusions  -given total of 1.5L IVF, blood pressure remains soft, but MAP starting to remain around 70 -will continue broad spectrum abx and trend PCT and lactic acid  -check d-dimer and BNP (echo with normal EF and diastolic function in 07/2024)  -if had vocal cord dysfunction-? Some component of negative pressure edema. Repeat CXR. Decrease IVF with stable MAP, may need IV lasix 

## 2024-11-22 NOTE — ED Triage Notes (Signed)
 Patient BIB EMS from home c/o SOB. Per EMS 85% on RA and 140'150's HR at the scene,  Epi neb , 500ml NS bolus given by EMS. Per report patient become unresponsive while eating with dinner with husband.

## 2024-11-22 NOTE — H&P (Signed)
 History and Physical    Patient: Debra Galvan FMW:[id number] DOB: 09-Dec-1946 DOA: 11/22/2024 DOS: the patient was seen and examined on 11/22/2024 PCP: Stephane Leita DEL, MD  Patient coming from: Home - lives with her husband. Ambulates independently.    Chief Complaint: respiratory distress   HPI: Debra Galvan is a 78 y.o. female with medical history significant of T2DM, asthma, HTN, HLD, GERD, Kozma skin cancer who became unresponsive while eating dinner with her husband. She all of a sudden could not get her breath. She was not choking. It feels like she can not get her breath. She can't speak and is gasping for air. She was eating routine food, nothing new. She maybe had 5-6 bites before this happened. She had a stressful day and had to put her cat down. This has happened two previous times and is not sure why it happened.   He has been feeling good. Denies any fever/chills, vision changes/headaches, chest pain, occasional palpitations, cough (chronic-dry), abdominal pain, N/V/D, dysuria or leg swelling. On lasix  for leg swelling. NO hx of CHF, normal echo in 07/2024.   She recently saw Dr. Darlean, ? Vocal cord dysfunction. Recommended max GERD tx and next step SLP eval. Also recommended judicious use of SABA given tendency to go into arrhythmia and that this is not asthma.   Admitted for similar symptoms in August. Was put on cpap and required epi. This was also in setting of covid.   She does not smoke or drink alcohol. Had a covid shot Monday.   ER Course:  vitals: afebrile, bp: 86/55>89/60, HR 70, RR: 23, oxygen: 90% on 6L  Pertinent labs: cbc: 18.4, potassium: 2.6, glucose: 382, BUN: 27, creatinine: 1.34, troponin 31, lactic acid: 5.0  CXR: cardiomegaly with heterogeneous perilar intersitital and ground glass opacity, may represent edema, pneumonia or hemorrhage. Suspicion of trace pleural effusions.  In ED: given 1L IVF bolus, ativan, magnesium , pottasium, race epi neb. Blood  cultures obtained. Rocephin and azithromycin started. [facility] asked to admit.  Discussed with PCCM, advised for TR[facility] to admit.   Review of Systems: As mentioned in the history of present illness. All other systems reviewed and are negative. Past Medical History:  Diagnosis Date   Allergy    Anemia    Arthritis    shoulder   Asthma    Cancer (HCC)    skin cancer   Cataract    small forming    Colitis 03/08/2021   Diabetes mellitus (HCC)    Diverticulosis    Essential hypertension 03/08/2021   GERD (gastroesophageal reflux disease)    Gout 07/13/2024   Heart murmur    Hyperlipidemia    Hypertension    Leukocytosis 03/08/2021   Mild intermittent asthma 12/25/2019   Mixed hyperlipidemia 12/26/2019   Osteoporosis    Subclinical hypothyroidism 07/13/2024   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   Type 2 diabetes mellitus with obesity 03/08/2021   IMO SNOMED Dx Update Oct 2024     Past Surgical History:  Procedure Laterality Date   ABDOMINAL HYSTERECTOMY     APPENDECTOMY     COLONOSCOPY     POLYPECTOMY  2005   TA polyp   Social History:  reports that she has never smoked. She has been exposed to tobacco smoke. She has never used smokeless tobacco. She reports current alcohol use. She reports that she does not use drugs.  Allergies[1]  Family History  Problem Relation Age of Onset  Stroke Mother    Stroke Father    Heart disease Father    Hypertension Father    Colon cancer Maternal Grandfather    Emphysema Maternal Aunt    Breast cancer Neg Hx    Colon polyps Neg Hx    Esophageal cancer Neg Hx    Stomach cancer Neg Hx    Rectal cancer Neg Hx    BRCA 1/2 Neg Hx     Prior to Admission medications  Medication Sig Start Date End Date Taking? Authorizing Provider  KLOR-CON  M20 20 MEQ tablet Take 20 mEq by mouth daily. 08/13/24  Yes [provider]  acetaminophen  (TYLENOL ) 500 MG tablet Take 500 mg by mouth every 6 (six)  hours as needed (for pain).    [provider]  albuterol  (VENTOLIN  HFA) 108 (90 Base) MCG/ACT inhaler Inhale 1 puff into the lungs every 6 (six) hours as needed for wheezing or shortness of breath. 03/10/21   [place], Camellia PARAS, DO  atenolol -chlorthalidone  (TENORETIC ) 100-25 MG per tablet Take 1 tablet by mouth daily.    [provider]  calcium -vitamin D (OSCAL WITH D) 250-125 MG-UNIT tablet Take 1 tablet by mouth daily.    [provider]  [employer] Lancets Micro Thin 33G MISC daily. for testing 07/29/19   [provider]  ergocalciferol (VITAMIN D2) 1.25 MG (50000 UT) capsule Take 50,000 Units by mouth every Saturday.    [provider]  ezetimibe  (ZETIA ) 10 MG tablet Take 10 mg by mouth daily.    [provider]  famotidine  (PEPCID ) 20 MG tablet One after supper 10/07/24   Wert, Michael B, MD  furosemide  (LASIX ) 20 MG tablet Take 1 tablet (20 mg total) by mouth daily. 07/18/24   Monetta Redell PARAS, MD  naproxen sodium (ALEVE) 220 MG tablet Take 220 mg by mouth 2 (two) times daily as needed (for pain).    [provider]  pantoprazole  (PROTONIX ) 40 MG tablet TAKE 1 TABLET (40 MG TOTAL) BY MOUTH DAILY. TAKE 30-60 MIN BEFORE FIRST MEAL OF THE DAY 11/11/24   Darlean Ozell NOVAK, MD  potassium chloride  (KLOR-CON ) 10 MEQ tablet Take 1 tablet (10 mEq total) by mouth daily. 08/19/24   Tobb, Kardie, DO  TRUETRACK TEST test strip CHECK DAILY BLOOD SUGAR 07/29/19   [provider]    Physical Exam: Vitals:   11/22/24 2115 11/22/24 2200 11/22/24 2230 11/22/24 2242  BP: (!) 89/60  (!) 94/56 95/61  Pulse: 75  67 75  Resp: (!) 26  (!) 23 (!) 21  Temp:      TempSrc:      SpO2: 92%  92% 96%  Weight:  75.9 kg     General:  Appears calm and comfortable and is in NAD Eyes:  PERRL, EOMI, normal lids, iris ENT:  grossly normal hearing, lips & tongue, mmm; appropriate dentition Neck:  no LAD, masses or thyromegaly; no carotid bruits, no JVD  Cardiovascular:   RRR, no m/r/g. No LE edema.  Respiratory:   CTA bilaterally with no wheezes/rales/rhonchi.  Normal respiratory effort. Abdomen:  soft, generalized TTP, ND, NABS Back:   normal alignment, no CVAT Skin:  no rash or induration seen on limited exam Musculoskeletal:  grossly normal tone BUE/BLE, good ROM, no bony abnormality Lower extremity:  No LE edema.  Limited foot exam with no ulcerations.  2+ distal pulses. Psychiatric:  grossly normal mood and affect, speech fluent and appropriate, AOx3 Neurologic:  CN 2-12 grossly intact, moves all extremities in  coordinated fashion, sensation intact   Radiological Exams on Admission: Independently reviewed - see discussion in A/P where applicable  DG Chest Portable 1 View Result Date: 11/22/2024 CLINICAL DATA:  Shortness of breath EXAM: PORTABLE CHEST 1 VIEW COMPARISON:  07/21/2024 FINDINGS: Mild cardiomegaly. Probable trace pleural effusions. Heterogeneous perihilar interstitial and ground-glass opacity. No pneumothorax IMPRESSION: Cardiomegaly with heterogeneous perihilar interstitial and ground-glass opacity, this may represent edema, pneumonia or hemorrhage. Suspicion of trace pleural effusions Electronically Signed   By: Luke Bun M.D.   On: 11/22/2024 20:36   DG Neck Soft Tissue Result Date: 11/22/2024 CLINICAL DATA:  Shortness of breath EXAM: NECK SOFT TISSUES - 1+ VIEW COMPARISON:  None Available. FINDINGS: Epiglottis demonstrates normal thickness. Slight hyper aeration of the hypopharynx. Normal prevertebral soft tissue thickness. Subglottic trachea is obscured by the patient's shoulders. IMPRESSION: Slight hyper aeration of the hypopharynx. Subglottic trachea is obscured by the patient's shoulders. No radiopaque foreign body in the included portions of the upper airway Electronically Signed   By: Luke Bun M.D.   On: 11/22/2024 20:35    EKG: Independently reviewed.   rate 91; nonspecific ST changes with no evidence of acute ischemia.  Borderline prolonged QT  Repeat pending    Labs on Admission: I have personally reviewed the available labs and imaging studies at the time of the admission.  Pertinent labs:   cbc: 18.4,  potassium: 2.6,  glucose: 382,  BUN: 27,  creatinine: 1.34,  troponin 31,  lactic acid: 5.0   Assessment and Plan: Principal Problem:   Acute respiratory failure with hypoxia (HCC) Active Problems:   Sepsis (HCC)   Hypokalemia   Elevated troponin   Controlled type 2 diabetes mellitus without complication, without long-term current use of insulin  with hyperglycemia   Essential hypertension   GERD (gastroesophageal reflux disease)   Hyperlipidemia   CKD stage 3a, GFR 45-59 ml/min (HCC)    Assessment and Plan: * Acute respiratory failure with hypoxia (HCC) 78 year old presenting to ED with sudden onset respiratory distress and hypoxia to 85% on RA with EMS requiring 6L HFNC -admit to stepdown -? Vocal cord dysfunction. This is her 3rd event and she had a stressful day with her cat dying. Acute event, just happened all of a sudden.  -CXR with cardiomegaly with heterogenous perihilar interstitial and ground glass opacity, may represent edema, pneumonia or hemorrhage. Suspicion of trace pleural effusions  -given total of 1.5L IVF, blood pressure remains soft, but MAP starting to remain around 70 -will continue broad spectrum abx and trend PCT and lactic acid  -check d-dimer and BNP (echo with normal EF and diastolic function in 07/2024)  -if had vocal cord dysfunction-? Some component of negative pressure edema. Repeat CXR. Decrease IVF with stable MAP, may need IV lasix   Sepsis (HCC) -Meeting SIRS criteria with leukocytosis, lactic acidosis and tachypnea. Source unknown, possibly pneumonia but she has been asymptomatic prior to the acute onset at dinner  -pan culture, UA has not been collected  -change to broad spectrum abx  -CXR with ground glass opacity, may represent edema, pneumonia or  hemorrhage. No hx of fever or cough  -trend lactic acid, possibly due to hypoxia  -trend PCT  Hypokalemia Check magnesium , but given IV mag in ED Repleted with 60meq and 10meq IV in ED Monitor on tele  She is on lasix  outpatient, no exogenous losses  Trend   Elevated troponin Likely demand ischemia in setting of hypoxia and respiratory failure EKG with no ST changes,  repeat pending No chest pain  Continue to cycle, watch on tele   Controlled type 2 diabetes mellitus without complication, without long-term current use of insulin  with hyperglycemia A1c of 6.9 in 07/2024  Appears to be diet controlled  Hyperglycemic, AG of 15. 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 Inpatient status is judged to be reasonable and necessary in order to provide the required intensity of service to ensure the patient's safety. The patient's presenting symptoms, physical exam findings, and initial radiographic and laboratory data in the context of their chronic comorbidities is felt to place them at high risk for further clinical deterioration. Furthermore, it is not anticipated that the patient will be medically stable for discharge from the hospital within 2 midnights of admission.   * I certify that at the point of admission it is my clinical judgment that the patient will  require inpatient hospital care spanning beyond 2 midnights from the point of admission due to high intensity of service, high risk for further deterioration and high frequency of surveillance required.*  Author: Isaiah Geralds, MD 11/22/2024 11:10 PM  For on call review [email or web address].      [1]  Allergies Allergen Reactions   Monosodium Glutamate Shortness Of Breath   Atorvastatin Other (See Comments)    Myalgias   Sulfa Antibiotics Other (See Comments)    Reaction occurred during childhood- reaction not recalled   Prednisone Other (See Comments)    Hyperactivity

## 2024-11-22 NOTE — Assessment & Plan Note (Addendum)
 Check magnesium , but given IV mag in ED Repleted with 60meq and 10meq IV in ED Monitor on tele  She is on lasix  outpatient, no exogenous losses  Trend

## 2024-11-22 NOTE — Assessment & Plan Note (Addendum)
 A1c of 6.9 in 07/2024  Appears to be diet controlled  Hyperglycemic, AG of 15. UA pending Given IVF, repeat bmp  Sensitive SSI and accuchecks QAC/HS  Consider long acting if sugar stays high

## 2024-11-22 NOTE — Assessment & Plan Note (Signed)
 Baseline creatinine around 1.1-1.2 Stable, continue to monitor

## 2024-11-22 NOTE — Assessment & Plan Note (Signed)
 Continue zetia.

## 2024-11-22 NOTE — ED Notes (Signed)
 Pt. I-stat Chem 8 results potassium 2.4 and I-stat CG4 Lactic Acid 5.01. EDP, Allen made aware.

## 2024-11-22 NOTE — Assessment & Plan Note (Signed)
 Hypotensive, hold anti HTN meds

## 2024-11-22 NOTE — Assessment & Plan Note (Signed)
 PPI IV

## 2024-11-22 NOTE — Progress Notes (Signed)
 Pharmacy Antibiotic Note  Debra  ORRA Galvan is a 78 y.o. female admitted on 11/22/2024 with acute SOB.  Pharmacy has been consulted to dose vancomycin for sepsis.  Plan: Vanc 1500mg  x 1 then 1500mg  q48h (AUC 487.8, Scr 1.4) Follow renal function and clinical course  Weight: 75.9 kg (167 lb 5.3 oz)  Temp (24hrs), Avg:97.8 F (36.6 C), Min:97.8 F (36.6 C), Max:97.8 F (36.6 C)  Recent Labs  Lab 11/22/24 1942 11/22/24 1944 11/22/24 1945  WBC 18.4*  --   --   CREATININE 1.34* 1.40*  --   LATICACIDVEN  --   --  5.0*    Estimated Creatinine Clearance: 33 mL/min (A) (by C-G formula based on SCr of 1.4 mg/dL (H)).    Allergies[1]  Antimicrobials this admission: 12/12 CTX x 1 12/12 azith x 1 12/13 vanc >> 12/13 cefepime >>  Dose adjustments this admission:   Microbiology results: 12/12 BCx:  12/12 UCx:     Thank you for allowing pharmacy to be a part of this patients care.  Leeroy Mace RPh 11/22/2024, 10:56 PM     [1]  Allergies Allergen Reactions   Monosodium Glutamate Shortness Of Breath   Atorvastatin Other (See Comments)    Myalgias   Sulfa Antibiotics Other (See Comments)    Reaction occurred during childhood- reaction not recalled   Prednisone Other (See Comments)    Hyperactivity

## 2024-11-22 NOTE — ED Provider Notes (Signed)
 [place] EMERGENCY DEPARTMENT AT [facility] Provider Note   CSN: [id number] Arrival date & time: 11/22/24  1919     Patient presents with: Shortness of Breath   [place]  Debra Galvan is a 78 y.o. female.   78 year old female presents with acute shortness of breath.  Patient does have a history of bronchospasm.  According to EMS at bedside patient was eating dinner with her husband when she became acutely short of breath.  Patient's Did die earlier today.  EMS gave patient racemic epinephrine placed on nonrebreather and transported here       Prior to Admission medications  Medication Sig Start Date End Date Taking? Authorizing Provider  acetaminophen  (TYLENOL ) 500 MG tablet Take 500 mg by mouth every 6 (six) hours as needed (for pain).    [provider]  albuterol  (VENTOLIN  HFA) 108 (90 Base) MCG/ACT inhaler Inhale 1 puff into the lungs every 6 (six) hours as needed for wheezing or shortness of breath. 03/10/21   [place], Camellia PARAS, DO  atenolol -chlorthalidone  (TENORETIC ) 100-25 MG per tablet Take 1 tablet by mouth daily.    [provider]  calcium -vitamin D (OSCAL WITH D) 250-125 MG-UNIT tablet Take 1 tablet by mouth daily.    [provider]  [employer] Lancets Micro Thin 33G MISC daily. for testing 07/29/19   [provider]  ergocalciferol (VITAMIN D2) 1.25 MG (50000 UT) capsule Take 50,000 Units by mouth every Saturday.    [provider]  ezetimibe  (ZETIA ) 10 MG tablet Take 10 mg by mouth daily.    [provider]  famotidine  (PEPCID ) 20 MG tablet One after supper 10/07/24   Wert, Michael B, MD  furosemide  (LASIX ) 20 MG tablet Take 1 tablet (20 mg total) by mouth daily. 07/18/24   Monetta Redell PARAS, MD  naproxen sodium (ALEVE) 220 MG tablet Take 220 mg by mouth 2 (two) times daily as needed (for pain).    [provider]  pantoprazole  (PROTONIX ) 40 MG tablet TAKE 1 TABLET (40 MG TOTAL) BY MOUTH DAILY. TAKE 30-60 MIN  BEFORE FIRST MEAL OF THE DAY 11/11/24   Darlean Ozell NOVAK, MD  potassium chloride  (KLOR-CON ) 10 MEQ tablet Take 1 tablet (10 mEq total) by mouth daily. 08/19/24   Tobb, Kardie, DO  TRUETRACK TEST test strip CHECK DAILY BLOOD SUGAR 07/29/19   [provider]    Allergies: Monosodium glutamate, Atorvastatin, Sulfa antibiotics, and Prednisone    Review of Systems  Unable to perform ROS: Acuity of condition    Updated Vital Signs BP (!) 175/88   Pulse 88   Resp (!) 33   SpO2 100%   Physical Exam Vitals and nursing note reviewed.  Constitutional:      General: She is not in acute distress.    Appearance: Normal appearance. She is well-developed. She is not toxic-appearing.  HENT:     Head: Normocephalic and atraumatic.  Eyes:     General: Lids are normal.     Conjunctiva/sclera: Conjunctivae normal.     Pupils: Pupils are equal, round, and reactive to light.  Neck:     Thyroid: No thyroid mass.     Trachea: No tracheal deviation.  Cardiovascular:     Rate and Rhythm: Regular rhythm. Tachycardia present.     Heart sounds: Normal heart sounds. No murmur heard.    No gallop.  Pulmonary:     Effort: Tachypnea, accessory muscle usage and respiratory distress present.     Breath sounds: Normal breath sounds.  No decreased breath sounds, wheezing, rhonchi or rales.     Comments: Possible expiratory stridor Abdominal:     General: There is no distension.     Palpations: Abdomen is soft.     Tenderness: There is no abdominal tenderness. There is no rebound.  Musculoskeletal:        General: No tenderness. Normal range of motion.     Cervical back: Normal range of motion and neck supple.  Skin:    General: Skin is warm and dry.     Findings: No abrasion or rash.  Neurological:     Mental Status: She is alert and oriented to person, place, and time. Mental status is at baseline.     GCS: GCS eye subscore is 4. GCS verbal subscore is 5. GCS motor subscore is 6.     Cranial  Nerves: No cranial nerve deficit.     Sensory: No sensory deficit.     Motor: Motor function is intact.  Psychiatric:        Attention and Perception: Attention normal.        Speech: Speech normal.        Behavior: Behavior normal.     (all labs ordered are listed, but only abnormal results are displayed) Labs Reviewed  BASIC METABOLIC PANEL WITH GFR  CBC  BLOOD GAS, ARTERIAL  I-STAT CHEM 8, ED  I-STAT CG4 LACTIC ACID, ED    EKG: EKG Interpretation Date/Time:  Friday November 22 2024 19:36:00 EST Ventricular Rate:  91 PR Interval:  204 QRS Duration:  127 QT Interval:  395 QTC Calculation: 486 R Axis:   -68  Text Interpretation: Unknown rhythm, irregular rate LVH with IVCD, LAD and secondary repol abnrm Borderline prolonged QT interval Confirmed by Dasie Faden ([id number]) on 11/22/2024 7:40:38 PM  Radiology: No results found.   Procedures   Medications Ordered in the ED  LORazepam (ATIVAN) 2 MG/ML injection (has no administration in time range)  Racepinephrine HCl 2.25 % nebulizer solution (has no administration in time range)  Racepinephrine HCl 2.25 % nebulizer solution 0.5 mL (has no administration in time range)                                    Medical Decision Making Amount and/or Complexity of Data Reviewed Labs: ordered. Radiology: ordered.  Risk OTC drugs. Prescription drug management.   Patient is EKG shows a regular rhythm.  No signs of acute ischemia.  Patient presenting respiratory distress.  Previous somewhat anxious.  Possible was some expiratory stridor.  Patient given racemic epinephrine as well as Ativan and improved greatly.  Patient's husband came to bedside.  States that this is patient's third occurrence of this and no etiology has been found as of yet.  Patient has significant hypokalemia and was given oral as well as IV potassium.  Patient arterial blood gas which shows pO2 of 125 pCO2 of 60 pH 7.28.  No signs of severe respiratory  compromise at this time.  Patient noted to have white count of 18.4 thousand.  Troponin slightly elevated at 31 suspect that this is demand.  Also give magnesium  as well to IV.  Chest x-ray shows possible pneumonia.  Patient elevated lactate of 5.  Started on IV fluid bolus after she became hypotensive.  Started on empiric antibiotics.  Patient monitored closely here and respiratory status greatly improved.  She cannot talk in full sentences.  Will  require admission   CRITICAL CARE Performed by: Curtistine ONEIDA Dawn Total critical care time: 65 minutes Critical care time was exclusive of separately billable procedures and treating other patients. Critical care was necessary to treat or prevent imminent or life-threatening deterioration. Critical care was time spent personally by me on the following activities: development of treatment plan with patient and/or surrogate as well as nursing, discussions with consultants, evaluation of patient's response to treatment, examination of patient, obtaining history from patient or surrogate, ordering and performing treatments and interventions, ordering and review of laboratory studies, ordering and review of radiographic studies, pulse oximetry and re-evaluation of patient's condition. .   Final diagnoses:  None    ED Discharge Orders     None          Dawn Curtistine, MD 11/22/24 2043

## 2024-11-22 NOTE — Assessment & Plan Note (Addendum)
-  Meeting SIRS criteria with leukocytosis, lactic acidosis and tachypnea. Source unknown, possibly pneumonia but she has been asymptomatic prior to the acute onset at dinner  -pan culture, UA has not been collected  -change to broad spectrum abx  -CXR with ground glass opacity, may represent edema, pneumonia or hemorrhage. No hx of fever or cough  -trend lactic acid, possibly due to hypoxia  -trend PCT

## 2024-11-22 NOTE — Assessment & Plan Note (Signed)
 Likely demand ischemia in setting of hypoxia and respiratory failure EKG with no ST changes, repeat pending No chest pain  Continue to cycle, watch on tele

## 2024-11-23 ENCOUNTER — Inpatient Hospital Stay (HOSPITAL_COMMUNITY)

## 2024-11-23 ENCOUNTER — Other Ambulatory Visit: Payer: Self-pay

## 2024-11-23 DIAGNOSIS — R6521 Severe sepsis with septic shock: Secondary | ICD-10-CM

## 2024-11-23 DIAGNOSIS — J385 Laryngeal spasm: Secondary | ICD-10-CM

## 2024-11-23 DIAGNOSIS — J189 Pneumonia, unspecified organism: Secondary | ICD-10-CM

## 2024-11-23 DIAGNOSIS — J9601 Acute respiratory failure with hypoxia: Secondary | ICD-10-CM | POA: Diagnosis present

## 2024-11-23 LAB — RENAL FUNCTION PANEL
Albumin: 3.1 g/dL — ABNORMAL LOW (ref 3.5–5.0)
Anion gap: 10 (ref 5–15)
BUN: 21 mg/dL (ref 8–23)
CO2: 27 mmol/L (ref 22–32)
Calcium: 8.4 mg/dL — ABNORMAL LOW (ref 8.9–10.3)
Chloride: 104 mmol/L (ref 98–111)
Creatinine, Ser: 1.02 mg/dL — ABNORMAL HIGH (ref 0.44–1.00)
GFR, Estimated: 56 mL/min — ABNORMAL LOW (ref >60)
Glucose, Bld: 143 mg/dL — ABNORMAL HIGH (ref 70–99)
Phosphorus: 2.6 mg/dL (ref 2.5–4.6)
Potassium: 3 mmol/L — ABNORMAL LOW (ref 3.5–5.1)
Sodium: 142 mmol/L (ref 135–145)

## 2024-11-23 LAB — COMPREHENSIVE METABOLIC PANEL WITH GFR
ALT: 13 U/L (ref 0–44)
AST: 26 U/L (ref 15–41)
Albumin: 3.4 g/dL — ABNORMAL LOW (ref 3.5–5.0)
Alkaline Phosphatase: 71 U/L (ref 38–126)
Anion gap: 14 (ref 5–15)
BUN: 26 mg/dL — ABNORMAL HIGH (ref 8–23)
CO2: 28 mmol/L (ref 22–32)
Calcium: 8.9 mg/dL (ref 8.9–10.3)
Chloride: 98 mmol/L (ref 98–111)
Creatinine, Ser: 1.18 mg/dL — ABNORMAL HIGH (ref 0.44–1.00)
GFR, Estimated: 47 mL/min — ABNORMAL LOW (ref >60)
Glucose, Bld: 215 mg/dL — ABNORMAL HIGH (ref 70–99)
Potassium: 2.9 mmol/L — ABNORMAL LOW (ref 3.5–5.1)
Sodium: 140 mmol/L (ref 135–145)
Total Bilirubin: 0.2 mg/dL (ref 0.0–1.2)
Total Protein: 6.2 g/dL — ABNORMAL LOW (ref 6.5–8.1)

## 2024-11-23 LAB — GLUCOSE, CAPILLARY
Glucose-Capillary: 160 mg/dL — ABNORMAL HIGH (ref 70–99)
Glucose-Capillary: 135 mg/dL — ABNORMAL HIGH (ref 70–99)
Glucose-Capillary: 159 mg/dL — ABNORMAL HIGH (ref 70–99)
Glucose-Capillary: 174 mg/dL — ABNORMAL HIGH (ref 70–99)

## 2024-11-23 LAB — LACTIC ACID, PLASMA
Lactic Acid, Venous: 4.3 mmol/L (ref 0.5–1.9)
Lactic Acid, Venous: 2.2 mmol/L (ref 0.5–1.9)
Lactic Acid, Venous: 2.9 mmol/L (ref 0.5–1.9)

## 2024-11-23 LAB — URINALYSIS, ROUTINE W REFLEX MICROSCOPIC
Bacteria, UA: NONE SEEN
Bilirubin Urine: NEGATIVE
Glucose, UA: NEGATIVE mg/dL
Hgb urine dipstick: NEGATIVE
Ketones, ur: NEGATIVE mg/dL
Nitrite: NEGATIVE
Protein, ur: NEGATIVE mg/dL
Specific Gravity, Urine: 1.025 (ref 1.005–1.030)
pH: 6 (ref 5.0–8.0)

## 2024-11-23 LAB — CBC
HCT: 37.1 % (ref 36.0–46.0)
Hemoglobin: 12.3 g/dL (ref 12.0–15.0)
MCH: 30 pg (ref 26.0–34.0)
MCHC: 33.2 g/dL (ref 30.0–36.0)
MCV: 90.5 fL (ref 80.0–100.0)
Platelets: 227 K/uL (ref 150–400)
RBC: 4.1 MIL/uL (ref 3.87–5.11)
RDW: 12.1 % (ref 11.5–15.5)
WBC: 21.1 K/uL — ABNORMAL HIGH (ref 4.0–10.5)
nRBC: 0 % (ref 0.0–0.2)

## 2024-11-23 LAB — ECHOCARDIOGRAM COMPLETE
AR max vel: 2.41 cm2
AV Peak grad: 4.4 mmHg
Ao pk vel: 1.05 m/s
Area-P 1/2: 3.68 cm2
Est EF: >75
Height: 64 in
S' Lateral: 2.5 cm
Weight: 2701.96 [oz_av]

## 2024-11-23 LAB — BASIC METABOLIC PANEL WITH GFR
Anion gap: 11 (ref 5–15)
BUN: 20 mg/dL (ref 8–23)
CO2: 29 mmol/L (ref 22–32)
Calcium: 8.9 mg/dL (ref 8.9–10.3)
Chloride: 101 mmol/L (ref 98–111)
Creatinine, Ser: 1.08 mg/dL — ABNORMAL HIGH (ref 0.44–1.00)
GFR, Estimated: 52 mL/min — ABNORMAL LOW (ref >60)
Glucose, Bld: 171 mg/dL — ABNORMAL HIGH (ref 70–99)
Potassium: 3.2 mmol/L — ABNORMAL LOW (ref 3.5–5.1)
Sodium: 140 mmol/L (ref 135–145)

## 2024-11-23 LAB — MRSA NEXT GEN BY PCR, NASAL: MRSA by PCR Next Gen: NOT DETECTED

## 2024-11-23 LAB — CORTISOL: Cortisol, Plasma: 9.8 ug/dL

## 2024-11-23 LAB — PROCALCITONIN: Procalcitonin: 1.31 ng/mL

## 2024-11-23 LAB — TROPONIN T, HIGH SENSITIVITY: Troponin T High Sensitivity: 260 ng/L (ref 0–19)

## 2024-11-23 LAB — PRO BRAIN NATRIURETIC PEPTIDE: Pro Brain Natriuretic Peptide: 3213 pg/mL — ABNORMAL HIGH (ref <300.0)

## 2024-11-23 LAB — MAGNESIUM
Magnesium: 2 mg/dL (ref 1.7–2.4)
Magnesium: 2 mg/dL (ref 1.7–2.4)

## 2024-11-23 MED ORDER — SODIUM CHLORIDE 0.9 % IV BOLUS
500.0000 mL | Freq: Once | INTRAVENOUS | Status: AC
  Administered 2024-11-23: 500 mL via INTRAVENOUS

## 2024-11-23 MED ORDER — FAMOTIDINE 20 MG PO TABS
20.0000 mg | ORAL_TABLET | Freq: Two times a day (BID) | ORAL | Status: DC
  Administered 2024-11-23 – 2024-11-27 (×8): 20 mg via ORAL
  Filled 2024-11-23 (×8): qty 1

## 2024-11-23 MED ORDER — SODIUM CHLORIDE 0.9% FLUSH: 10.0000 mL | INTRAVENOUS | Status: DC | PRN

## 2024-11-23 MED ORDER — MIDODRINE HCL 5 MG PO TABS: 10.0000 mg | ORAL_TABLET | Freq: Three times a day (TID) | ORAL | Status: DC

## 2024-11-23 MED ORDER — NOREPINEPHRINE 4 MG/250ML-% IV SOLN
0.0000 ug/min | INTRAVENOUS | Status: DC
  Administered 2024-11-23: 7 ug/min via INTRAVENOUS
  Administered 2024-11-23: 2 ug/min via INTRAVENOUS
  Filled 2024-11-23 (×3): qty 250

## 2024-11-23 MED ORDER — CARMEX CLASSIC LIP BALM EX OINT
TOPICAL_OINTMENT | CUTANEOUS | Status: DC | PRN
  Filled 2024-11-23: qty 10

## 2024-11-23 MED ORDER — POTASSIUM CHLORIDE CRYS ER 20 MEQ PO TBCR
40.0000 meq | EXTENDED_RELEASE_TABLET | Freq: Four times a day (QID) | ORAL | Status: AC
  Administered 2024-11-23 (×2): 40 meq via ORAL
  Filled 2024-11-23 (×2): qty 2

## 2024-11-23 MED ORDER — BUMETANIDE 0.25 MG/ML IJ SOLN
1.0000 mg | Freq: Once | INTRAMUSCULAR | Status: AC
  Administered 2024-11-24: 1 mg via INTRAVENOUS
  Filled 2024-11-23: qty 4

## 2024-11-23 MED ORDER — SODIUM CHLORIDE 0.9% FLUSH
10.0000 mL | Freq: Two times a day (BID) | INTRAVENOUS | Status: DC
  Administered 2024-11-23: 10 mL
  Administered 2024-11-24: 20 mL
  Administered 2024-11-25 – 2024-11-27 (×2): 10 mL

## 2024-11-23 MED ORDER — ORAL CARE MOUTH RINSE: 15.0000 mL | OROMUCOSAL | Status: DC | PRN

## 2024-11-23 MED ORDER — SODIUM CHLORIDE 0.9 % IV SOLN
250.0000 mL | INTRAVENOUS | Status: AC
  Administered 2024-11-23 (×2): 250 mL via INTRAVENOUS

## 2024-11-23 MED ORDER — METRONIDAZOLE 500 MG/100ML IV SOLN
500.0000 mg | Freq: Two times a day (BID) | INTRAVENOUS | Status: DC
  Administered 2024-11-23: 500 mg via INTRAVENOUS
  Filled 2024-11-23: qty 100

## 2024-11-23 MED ORDER — BUMETANIDE 0.25 MG/ML IJ SOLN
1.0000 mg | Freq: Once | INTRAMUSCULAR | Status: AC
  Administered 2024-11-23: 1 mg via INTRAVENOUS
  Filled 2024-11-23: qty 4

## 2024-11-23 MED ORDER — SODIUM CHLORIDE 0.9 % IV SOLN
500.0000 mg | Freq: Every day | INTRAVENOUS | Status: AC
  Administered 2024-11-23 – 2024-11-24 (×2): 500 mg via INTRAVENOUS
  Filled 2024-11-23 (×2): qty 5

## 2024-11-23 MED ORDER — CHLORHEXIDINE GLUCONATE CLOTH 2 % EX PADS
6.0000 | MEDICATED_PAD | Freq: Every day | CUTANEOUS | Status: DC
  Administered 2024-11-23 – 2024-11-27 (×5): 6 via TOPICAL

## 2024-11-23 MED ORDER — POTASSIUM CHLORIDE 10 MEQ/100ML IV SOLN
10.0000 meq | INTRAVENOUS | Status: AC
  Administered 2024-11-23 (×4): 10 meq via INTRAVENOUS
  Filled 2024-11-23 (×4): qty 100

## 2024-11-23 NOTE — Progress Notes (Addendum)
 eLink Physician-Brief Progress Note Patient Name: Debra Galvan DOB: 29-Jan-1946 MRN: [id number]   Date of Service  11/23/2024  HPI/Events of Note  Lactic acid 4.3, CT chest suggests pulmonary edema although multi-focal pneumonia could not be ruled out, WBC 21 K.  eICU Interventions  Trend lactic acid, Empiric antibiotics, echocardiogram & BNP.        Debra Galvan 11/23/2024, 4:26 AM

## 2024-11-23 NOTE — Progress Notes (Signed)
 Peripherally Inserted Central Catheter Placement  The IV Nurse has discussed with the patient and/or persons authorized to consent for the patient, the purpose of this procedure and the potential benefits and risks involved with this procedure.  The benefits include less needle sticks, lab draws from the catheter, and the patient may be discharged home with the catheter. Risks include, but not limited to, infection, bleeding, blood clot (thrombus formation), and puncture of an artery; nerve damage and irregular heartbeat and possibility to perform a PICC exchange if needed/ordered by physician.  Alternatives to this procedure were also discussed.  Bard Power PICC patient education guide, fact sheet on infection prevention and patient information card has been provided to patient /or left at bedside.    PICC Placement Documentation  PICC Double Lumen 11/23/24 Right Basilic 39 cm 0 cm (Active)  Indication for Insertion or Continuance of Line Vasoactive infusions 11/23/24 1837  Exposed Catheter (cm) 0 cm 11/23/24 1837  Site Assessment Clean, Dry, Intact 11/23/24 1837  Lumen #1 Status Flushed;Saline locked;Blood return noted 11/23/24 1837  Lumen #2 Status Flushed;Saline locked;Blood return noted 11/23/24 1837  Dressing Type Transparent;Securing device 11/23/24 1837  Dressing Status Antimicrobial disc/dressing in place;Clean, Dry, Intact 11/23/24 1837  Line Care Connections checked and tightened 11/23/24 1837  Line Adjustment (NICU/IV Team Only) No 11/23/24 1837  Dressing Intervention New dressing;Adhesive placed at insertion site (IV team only);Adhesive placed around edges of dressing (IV team/ICU RN only) 11/23/24 1837  Dressing Change Due 11/30/24 11/23/24 1837       Debra Galvan 11/23/2024, 6:38 PM

## 2024-11-23 NOTE — Plan of Care (Signed)
°  Problem: Metabolic: Goal: Ability to maintain appropriate glucose levels will improve Outcome: Progressing   Problem: Nutritional: Goal: Maintenance of adequate nutrition will improve Outcome: Progressing   Problem: Clinical Measurements: Goal: Will remain free from infection Outcome: Progressing Goal: Diagnostic test results will improve Outcome: Progressing Goal: Respiratory complications will improve Outcome: Progressing Goal: Cardiovascular complication will be avoided Outcome: Not Progressing   Problem: Elimination: Goal: Will not experience complications related to bowel motility Outcome: Not Progressing Goal: Will not experience complications related to urinary retention Outcome: Progressing

## 2024-11-23 NOTE — Progress Notes (Signed)
 Critical care attending progress note:      11/23/2024   Ms. 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 The patient was seen by Dr. Olena overnight from [facility] group.  Interval Events: patient's BP continued to deteriorate this AM, started on levophed  drip.  Vitals Afebrile, HR 60 - 70s, hemodynamic sustained with levophed , SPO2 > 90% on 6 L O2 by [place]  I/O: + 3.3 L  Drips: Levophed   Lines/Tubes:  Patient Lines/Drains/Airways Status     Active Line/Drains/Airways     Name Placement date Placement time Site Days   Peripheral IV 11/22/24 18 G Left Antecubital 11/22/24  1942  Antecubital  1   Peripheral IV 11/22/24 20 G 1 Anterior;Right Hand 11/22/24  2002  Hand  1            Physical Examination: awake, alert, oriented x 3, follows commands, RRR, no significant murmurs, JVD 10 cm H2O, inspiratory crackles bilaterally on posterior auscultation, no significant lower extremity edema.  Pertinent Labs: ProBNP 3213, Lactate 5 --> 4 --> 2.9, WBC 21 K, Hgb 12.3, PLT 227, Na 140, K 2.9, bicarb 28, Cr 1.18, trop 203 to 260  Micro: MRSA swab negative, Blood culture x 2 NGTD  Imaging: CTA chest reviewed and shows evidence of interlobular septal thickening with ground glass and consolidative opacities that look like pulmonary edema. TTE shows normal LVEF with G1DD.  Assessment and Plan:  #Shock:  > likely septic shock due to pneumonia. Echo reviewed and there's no evidence of cardiogenic or obstructive shock. - PICC nurse for central access - Levophed  drip - Keep MAP > 65 mmHg  #Acute Hypoxic Respiratory Failure: #Pulmonary Edema: #Community Acquired Pneumonia: > Likely due to acute onset pulmonary edema from VCD or  acute on chronic diastolic heart failure. It is also possible patient may have superimposed pneumonia. - D/C flagyl  - Azithromycin  (end date 12/14) - Cefepime  (end date 12/16) - Bumex  1 mg IVP x 1 - Replete lytes at high goal, recheck BMP + Mg at 4 PM  #GERD: - Continue PPI - Continue H2 Blocker  #DM: A1c 6.9 - Sensitive SSI  #VTE ppx: Lovenox  subQ  Due to a high probability of clinically significant, life threatening deterioration, the patient required my highest level of preparedness to intervene emergently and I personally spent this critical care time directly and personally managing the patient. This critical care time included obtaining a history; examining the patient; pulse oximetry; ordering and review of studies; arranging urgent treatment with development of a management plan; evaluation of patient's response to treatment; frequent reassessment; and, discussions with other providers.  This critical care time was performed to assess and manage the high probability of imminent, life-threatening deterioration that could result in multi-organ failure. It was exclusive of separately billable procedures and treating other patients and teaching time. Critical Care Time: 40 minutes.   Paula Southerly, MD [facility] Prefer epic messenger for cross cover needs

## 2024-11-23 NOTE — Progress Notes (Signed)
 PROGRESS NOTE    Debra  MIKKI Galvan  FMW:[id number] DOB: 22-Apr-1946 DOA: 11/22/2024 PCP: Stephane Leita DEL, MD  Outpatient Specialists:     Brief Narrative:  Patient is a 78 year old female with past medical history significant for diabetes mellitus, diverticulosis, hypertension, hyperlipidemia, asthma, type 2 diabetes mellitus, and query vocal cord dysfunction.  Patient was said to have become more unresponsive while eating dinner with her husband.  Associated shortness of breath.  12/10/2024: Patient seen alongside patient's husband.  Patient has been hypotensive.  Despite aggressive volume resuscitation, patient remains hypotensive.  Critical care team was reconsulted, and CCM will assume primary role.  Worsening leukocytosis (21.1) noted.  UA nasal swab for MRSA by PCR came back negative.  Reveals large leukocyte and SG of 1.025.  Blood and urine cultures are pending.  Patient is on antibiotics.   CTA chest reveals: 1. No pulmonary embolism. 2. Multifocal patchy opacities, favoring mild to moderate interstitial edema, although multifocal pneumonia/aspiration remains possible. 3. Superimposed bilateral lower lobe atelectasis.  Echo revealed: 1. Left ventricular ejection fraction, by estimation, is >75%. The left  ventricle has hyperdynamic function. The left ventricle has no regional  wall motion abnormalities. The left ventricular internal cavity size was  mildly dilated. Left ventricular  diastolic parameters are consistent with Grade I diastolic dysfunction  (impaired relaxation). The average left ventricular global longitudinal  strain is -19.8 %. The global longitudinal strain is normal.   2. Right ventricular systolic function is normal. The right ventricular  size is normal.   3. The mitral valve is normal in structure. Trivial mitral valve  regurgitation. No evidence of mitral stenosis.   4. The aortic valve is tricuspid. Aortic valve regurgitation is not  visualized. Aortic  valve sclerosis is present, with no evidence of aortic  valve stenosis.   5. Aortic dilatation noted. There is mild dilatation of the ascending  aorta, measuring 39 mm.   6. The inferior vena cava is normal in size with greater than 50%  respiratory variability, suggesting right atrial pressure of 3 mmHg.    Assessment & Plan:   Principal Problem:   Acute hypoxemic respiratory failure (HCC) Active Problems:   Essential hypertension   GERD (gastroesophageal reflux disease)   Hyperlipidemia   Hypokalemia   Elevated troponin   Controlled type 2 diabetes mellitus without complication, without long-term current use of insulin  with hyperglycemia   Sepsis (HCC)   CKD stage 3a, GFR 45-59 ml/min (HCC)   Laryngospasm    Acute respiratory failure with hypoxia (HCC) 78 year old presenting to ED with sudden onset respiratory distress and hypoxia to 85% on RA with EMS requiring 6L HFNC -? Vocal cord dysfunction. This is her 3rd event and she had a stressful day with her cat dying. Acute event, just happened all of a sudden.  -CXR with cardiomegaly with heterogenous perihilar interstitial and ground glass opacity, may represent edema, pneumonia or hemorrhage. Suspicion of trace pleural effusions  -CCM input is highly appreciated. -will continue broad spectrum abx and trend PCT and lactic acid  - D-dimer 4.47 and proBNP of 3213.     Sepsis/SIRS: -Meeting SIRS criteria with leukocytosis, lactic acidosis and tachypnea.  -Source unknown -Follow cultures.  -CXR with ground glass opacity, may represent edema, pneumonia or hemorrhage. No hx of fever or cough  -trend lactic acid, possibly due to hypoxia  -trend PCT   Hypokalemia -Potassium of 2.6 on presentation. - Normal magnesium . - Continue to monitor and replete.     Elevated  troponin Likely demand ischemia in setting of hypoxia and respiratory failure EKG with no ST changes Troponin of 203-260. No chest pain    Controlled type 2  diabetes mellitus without complication, without long-term current use of insulin  with hyperglycemia A1c of 6.9 in 07/2024  Appears to be diet controlled  Continue sensitive SSI and accuchecks QAC/HS  Consider long acting if sugar stays high    Essential hypertension/hypotension: -Patient is currently hypotensive. - CCM consulted. - CCM will assume primary role and manage patient accordingly.   GERD (gastroesophageal reflux disease) PPI IV    Hyperlipidemia Continue zetia     CKD stage 3a, GFR 45-59 ml/min (HCC) Baseline creatinine around 1.1-1.2 Stable, continue to monitor   DVT prophylaxis: Subcutaneous Lovenox  Code Status: Full code Family Communication: Husband by bedside Disposition Plan: This will depend on hospital course   Consultants:  Critical care medicine  Procedures:  None  Antimicrobials:  IV vancomycin  discontinued. IV cefepime .   Subjective: No new complaints. Not in any distress.  Objective: Vitals:   11/23/24 1130 11/23/24 1200 11/23/24 1205 11/23/24 1223  BP: (!) 87/48   (!) 89/39  Pulse: 76 69  (!) 54  Resp: (!) 24 (!) 26  (!) 22  Temp:   98.2 F (36.8 C)   TempSrc:   Oral   SpO2: 96% (!) 86%  98%  Weight:      Height:        Intake/Output Summary (Last 24 hours) at 11/23/2024 1234 Last data filed at 11/23/2024 1115 Gross per 24 hour  Intake 4085.39 ml  Output 980 ml  Net 3105.39 ml   Filed Weights   11/22/24 2200 11/23/24 0030 11/23/24 0400  Weight: 75.9 kg 76.6 kg 76.6 kg    Examination:  General exam: Appears calm and comfortable  Respiratory system: Clear to auscultation. Respiratory effort normal. Cardiovascular system: S1 & S2 heard Gastrointestinal system: Abdomen is soft and nontender.  Central nervous system: Awake and alert.    Data Reviewed: I have personally reviewed following labs and imaging studies  CBC: Recent Labs  Lab 11/22/24 1942 11/22/24 1944 11/23/24 0127  WBC 18.4*  --  21.1*  HGB 13.5 14.3  12.3  HCT 41.5 42.0 37.1  MCV 93.3  --  90.5  PLT 351  --  227   Basic Metabolic Panel: Recent Labs  Lab 11/22/24 1942 11/22/24 1944 11/22/24 2244 11/23/24 0127  NA 139 138  --  140  K 2.6* 2.4*  --  2.9*  CL 94* 93*  --  98  CO2 30  --   --  28  GLUCOSE 382* 370*  --  215*  BUN 27* 29*  --  26*  CREATININE 1.34* 1.40*  --  1.18*  CALCIUM  8.7*  --   --  8.9  MG  --   --  2.2  --    GFR: Estimated Creatinine Clearance: 39.4 mL/min (A) (by C-G formula based on SCr of 1.18 mg/dL (H)). Liver Function Tests: Recent Labs  Lab 11/23/24 0127  AST 26  ALT 13  ALKPHOS 71  BILITOT 0.2  PROT 6.2*  ALBUMIN 3.4*   No results for input(s): LIPASE, AMYLASE in the last 168 hours. No results for input(s): AMMONIA in the last 168 hours. Coagulation Profile: No results for input(s): INR, PROTIME in the last 168 hours. Cardiac Enzymes: No results for input(s): CKTOTAL, CKMB, CKMBINDEX, TROPONINI in the last 168 hours. BNP (last 3 results) Recent Labs    11/22/24 2244  11/23/24 0445  PROBNP 384.0* 3,213.0*   HbA1C: No results for input(s): HGBA1C in the last 72 hours. CBG: Recent Labs  Lab 11/22/24 2318 11/23/24 0803 11/23/24 1132  GLUCAP 260* 160* 135*   Lipid Profile: No results for input(s): CHOL, HDL, LDLCALC, TRIG, CHOLHDL, LDLDIRECT in the last 72 hours. Thyroid Function Tests: No results for input(s): TSH, T4TOTAL, FREET4, T3FREE, THYROIDAB in the last 72 hours. Anemia Panel: No results for input(s): VITAMINB12, FOLATE, FERRITIN, TIBC, IRON, RETICCTPCT in the last 72 hours. Urine analysis:    Component Value Date/Time   COLORURINE YELLOW 11/23/2024 0341   APPEARANCEUR CLEAR 11/23/2024 0341   LABSPEC 1.025 11/23/2024 0341   PHURINE 6.0 11/23/2024 0341   GLUCOSEU NEGATIVE 11/23/2024 0341   HGBUR NEGATIVE 11/23/2024 0341   BILIRUBINUR NEGATIVE 11/23/2024 0341   KETONESUR NEGATIVE 11/23/2024 0341   PROTEINUR  NEGATIVE 11/23/2024 0341   NITRITE NEGATIVE 11/23/2024 0341   LEUKOCYTESUR LARGE (A) 11/23/2024 0341   Sepsis Labs: @LABRCNTIP (procalcitonin:4,lacticidven:4)  ) Recent Results (from the past 240 hours)  Culture, blood (Routine X 2) w Reflex to ID Panel     Status: None (Preliminary result)   Collection Time: 11/22/24  8:39 PM   Specimen: BLOOD  Result Value Ref Range Status   Specimen Description   Final    BLOOD LEFT ANTECUBITAL Performed at [facility], [address], [place], [place] [address]    Special Requests   Final    BOTTLES DRAWN AEROBIC ONLY Blood Culture results may not be optimal due to an inadequate volume of blood received in culture bottles Performed at [facility], [address] [place], [place] [address]    Culture   Final    NO GROWTH < 12 HOURS Performed at [facility], [address], [place], [place] [address]    Report Status PENDING  Incomplete  Culture, blood (Routine X 2) w Reflex to ID Panel     Status: None (Preliminary result)   Collection Time: 11/22/24  8:40 PM   Specimen: BLOOD  Result Value Ref Range Status   Specimen Description   Final    BLOOD SITE NOT SPECIFIED Performed at [facility], [address], [place], [place] [address]    Special Requests   Final    BOTTLES DRAWN AEROBIC AND ANAEROBIC Blood Culture results may not be optimal due to an inadequate volume of blood received in culture bottles Performed at [facility], [address], [place], [place] [address]    Culture   Final    NO GROWTH < 12 HOURS Performed at [facility], [address] [place], [place] [address]    Report Status PENDING  Incomplete  MRSA Next Gen by PCR, Nasal     Status: None   Collection Time: 11/23/24 12:58 AM   Specimen: Nasal Mucosa; Nasal Swab  Result Value Ref Range Status   MRSA by PCR Next Gen NOT DETECTED NOT DETECTED Final    Comment:  (NOTE) The GeneXpert MRSA Assay (FDA approved for NASAL specimens only), is one component of a comprehensive MRSA colonization surveillance program. It is not intended to diagnose MRSA infection nor to guide or monitor treatment for MRSA infections. Test performance is not FDA approved in patients less than 54 years old. Performed at [facility], [address] [place], [place] [address]          Radiology Studies: ECHOCARDIOGRAM COMPLETE Result Date: 11/23/2024  ECHOCARDIOGRAM REPORT   Patient Name:   Debra  LOISE Galvan Date of Exam: 11/23/2024 Medical Rec #:  [id number]        Height:       64.0 in Accession #:    [id number]       Weight:       168.9 lb Date of Birth:  01/13/1946        BSA:          1.821 m Patient Age:    54 years         BP:           92/41 mmHg Patient Gender: F                HR:           69 bpm. Exam Location:  Inpatient Procedure: 2D Echo, 3D Echo, Cardiac Doppler, Color Doppler and Strain Analysis            (Both Spectral and Color Flow Doppler were utilized during            procedure). STAT ECHO Indications:    Cardiomyopathy  History:        Patient has prior history of Echocardiogram examinations, most                 recent 07/22/2024. CHF and Cardiomyopathy, Signs/Symptoms:Murmur;                 Risk Factors:Hypertension, Diabetes, Dyslipidemia and Second                 Hand Smoker. Cancer, GERD.  Sonographer:    Nathanel Devonshire Referring Phys: [id number] MARCELLINA RAYMOND DUB IMPRESSIONS  1. Left ventricular ejection fraction, by estimation, is >75%. The left ventricle has hyperdynamic function. The left ventricle has no regional wall motion abnormalities. The left ventricular internal cavity size was mildly dilated. Left ventricular diastolic parameters are consistent with Grade I diastolic dysfunction (impaired relaxation). The average left ventricular global longitudinal strain is -19.8 %. The global longitudinal strain is normal.  2. Right ventricular  systolic function is normal. The right ventricular size is normal.  3. The mitral valve is normal in structure. Trivial mitral valve regurgitation. No evidence of mitral stenosis.  4. The aortic valve is tricuspid. Aortic valve regurgitation is not visualized. Aortic valve sclerosis is present, with no evidence of aortic valve stenosis.  5. Aortic dilatation noted. There is mild dilatation of the ascending aorta, measuring 39 mm.  6. The inferior vena cava is normal in size with greater than 50% respiratory variability, suggesting right atrial pressure of 3 mmHg. FINDINGS  Left Ventricle: Left ventricular ejection fraction, by estimation, is >75%. The left ventricle has hyperdynamic function. The left ventricle has no regional wall motion abnormalities. The average left ventricular global longitudinal strain is -19.8 %. Strain was performed and the global longitudinal strain is normal. The left ventricular internal cavity size was mildly dilated. There is no left ventricular hypertrophy. Left ventricular diastolic parameters are consistent with Grade I diastolic dysfunction (impaired relaxation). Right Ventricle: The right ventricular size is normal. Right ventricular systolic function is normal. Left Atrium: Left atrial size was normal in size. Right Atrium: Right atrial size was normal in size. Pericardium: There is no evidence of pericardial effusion. Mitral Valve: The mitral valve is normal in structure. Trivial mitral valve regurgitation. No evidence of mitral valve stenosis. Tricuspid Valve: The tricuspid valve is normal in structure. Tricuspid valve regurgitation is trivial. No evidence  of tricuspid stenosis. Aortic Valve: The aortic valve is tricuspid. Aortic valve regurgitation is not visualized. Aortic valve sclerosis is present, with no evidence of aortic valve stenosis. Aortic valve peak gradient measures 4.4 mmHg. Pulmonic Valve: The pulmonic valve was not well visualized. Pulmonic valve regurgitation  is not visualized. No evidence of pulmonic stenosis. Aorta: Aortic dilatation noted. There is mild dilatation of the ascending aorta, measuring 39 mm. Venous: The inferior vena cava is normal in size with greater than 50% respiratory variability, suggesting right atrial pressure of 3 mmHg. IAS/Shunts: No atrial level shunt detected by color flow Doppler. Additional Comments: 3D was performed not requiring image post processing on an independent workstation and was normal.  LEFT VENTRICLE PLAX 2D LVIDd:         5.30 cm   Diastology LVIDs:         2.50 cm   LV e' medial:    6.20 cm/s LV PW:         1.00 cm   LV E/e' medial:  11.6 LV IVS:        0.90 cm   LV e' lateral:   8.05 cm/s LVOT diam:     1.70 cm   LV E/e' lateral: 8.9 LV SV:         54 LV SV Index:   30        2D Longitudinal Strain LVOT Area:     2.27 cm  2D Strain GLS Avg:     -19.8 %                           3D Volume EF:                          3D EF:        66 %                          LV EDV:       101 ml                          LV ESV:       34 ml                          LV SV:        67 ml RIGHT VENTRICLE             IVC RV Basal diam:  3.90 cm     IVC diam: 1.70 cm RV Mid diam:    2.60 cm RV S prime:     10.10 cm/s TAPSE (M-mode): 2.0 cm LEFT ATRIUM             Index        RIGHT ATRIUM           Index LA diam:        4.20 cm 2.31 cm/m   RA Area:     19.50 cm LA Vol (A2C):   77.9 ml 42.79 ml/m  RA Volume:   61.20 ml  33.61 ml/m LA Vol (A4C):   63.2 ml 34.71 ml/m LA Biplane Vol: 71.8 ml 39.44 ml/m  AORTIC VALVE AV Area (Vmax): 2.41 cm AV Vmax:        105.00 cm/s AV Peak Grad:   4.4 mmHg LVOT  Vmax:      111.33 cm/s LVOT Vmean:     78.433 cm/s LVOT VTI:       0.240 m  AORTA Ao Root diam: 3.00 cm Ao Asc diam:  4.00 cm MITRAL VALVE MV Area (PHT): 3.68 cm    SHUNTS MV Decel Time: 206 msec    Systemic VTI:  0.24 m MV E velocity: 71.80 cm/s  Systemic Diam: 1.70 cm MV A velocity: 76.20 cm/s MV E/A ratio:  0.94 Redell Shallow MD Electronically  signed by Redell Shallow MD Signature Date/Time: 11/23/2024/11:20:38 AM    Final    CT Angio Chest Pulmonary Embolism (PE) W or WO Contrast Result Date: 11/23/2024 EXAM: CTA of the Chest with contrast for PE 11/22/2024 11:58:32 PM TECHNIQUE: CTA of the chest was performed without and with the administration of 60 mL of iohexol  (OMNIPAQUE ) 350 MG/ML injection. Multiplanar reformatted images are provided for review. MIP images are provided for review. Automated exposure control, iterative reconstruction, and/or weight based adjustment of the mA/kV was utilized to reduce the radiation dose to as low as reasonably achievable. COMPARISON: Chest radiograph earlier today and CTA chest dated 07/13/2024. CLINICAL HISTORY: Pulmonary embolism (PE) suspected, high prob. FINDINGS: PULMONARY ARTERIES: Pulmonary arteries are adequately opacified for evaluation. No pulmonary embolism. Main pulmonary artery is normal in caliber. MEDIASTINUM: Mild cardiomegaly. The pericardium demonstrates no acute abnormality. Mild thoracic aortic atherosclerosis. LYMPH NODES: No mediastinal, hilar or axillary lymphadenopathy. LUNGS AND PLEURA: Multifocal patchy opacities in the lungs bilaterally, with mild interlobular septal thickening in the upper lobes, favoring mild to moderate interstitial edema. However, multifocal pneumonia or aspiration remains possible. Mild patchy bilateral lower lobe opacities, favoring superimposed atelectasis. No pleural effusion or pneumothorax. UPPER ABDOMEN: Limited images of the upper abdomen are unremarkable. SOFT TISSUES AND BONES: No acute bone or soft tissue abnormality. IMPRESSION: 1. No pulmonary embolism. 2. Multifocal patchy opacities, favoring mild to moderate interstitial edema, although multifocal pneumonia/aspiration remains possible. 3. Superimposed bilateral lower lobe atelectasis. Electronically signed by: Pinkie Pebbles MD 11/23/2024 12:02 AM EST RP Workstation: [id number]   DG Chest  Portable 1 View Result Date: 11/22/2024 CLINICAL DATA:  Shortness of breath EXAM: PORTABLE CHEST 1 VIEW COMPARISON:  07/21/2024 FINDINGS: Mild cardiomegaly. Probable trace pleural effusions. Heterogeneous perihilar interstitial and ground-glass opacity. No pneumothorax IMPRESSION: Cardiomegaly with heterogeneous perihilar interstitial and ground-glass opacity, this may represent edema, pneumonia or hemorrhage. Suspicion of trace pleural effusions Electronically Signed   By: Luke Bun M.D.   On: 11/22/2024 20:36   DG Neck Soft Tissue Result Date: 11/22/2024 CLINICAL DATA:  Shortness of breath EXAM: NECK SOFT TISSUES - 1+ VIEW COMPARISON:  None Available. FINDINGS: Epiglottis demonstrates normal thickness. Slight hyper aeration of the hypopharynx. Normal prevertebral soft tissue thickness. Subglottic trachea is obscured by the patient's shoulders. IMPRESSION: Slight hyper aeration of the hypopharynx. Subglottic trachea is obscured by the patient's shoulders. No radiopaque foreign body in the included portions of the upper airway Electronically Signed   By: Luke Bun M.D.   On: 11/22/2024 20:35        Scheduled Meds:  Chlorhexidine  Gluconate Cloth  6 each Topical Daily   enoxaparin  (LOVENOX ) injection  40 mg Subcutaneous Q24H   ezetimibe   10 mg Oral Daily   famotidine   20 mg Oral Daily   insulin  aspart  0-5 Units Subcutaneous QHS   insulin  aspart  0-9 Units Subcutaneous TID WC   pantoprazole  (PROTONIX ) IV  40 mg Intravenous Q24H   Continuous Infusions:  ceFEPime  (MAXIPIME ) IV  Stopped (11/23/24 0421)     LOS: 1 day    Time spent: 55 minutes.    Leatrice Chapel, MD  [facility] Pager #: [phone] 7PM-7AM contact night coverage as above

## 2024-11-23 NOTE — Plan of Care (Signed)
   Problem: Clinical Measurements: Goal: Ability to maintain clinical measurements within normal limits will improve Outcome: Not Progressing Goal: Will remain free from infection Outcome: Not Progressing Goal: Diagnostic test results will improve Outcome: Not Progressing Goal: Respiratory complications will improve Outcome: Not Progressing Goal: Cardiovascular complication will be avoided Outcome: Not Progressing

## 2024-11-23 NOTE — Progress Notes (Signed)
 eLink Physician-Brief Progress Note Patient Name: FELINA TELLO DOB: 1946-01-08 MRN: [id number]   Date of Service  11/23/2024  HPI/Events of Note  Patient admitted for further evaluation of shortness of breath that abruptly developed in the context of having a meal, work up in progress.  eICU Interventions  New Patient Evaluation.        Maejor Erven U Klaira Pesci 11/23/2024, 12:57 AM

## 2024-11-23 NOTE — Consult Note (Signed)
 Name: Debra Galvan  REHEMA MUFFLEY MRN: [id number] DOB: 1946/07/03    ADMISSION DATE:  11/22/2024 CONSULTATION DATE:  11/22/2024  REFERRING MD :  Isaiah Geralds, MD  CHIEF COMPLAINT:  Hypoxemia, shortness of breath  BRIEF PATIENT DESCRIPTION: 78 F admitted with acute hypoxemic respiratory failure in setting of suspected episode of laryngospasm complicated by acute pulmonary edema.  SIGNIFICANT EVENTS  12/12: Admitted with AHRF, requiring 6 L [place] to saturate low 90s. CTA Chest neg for PE.   HISTORY OF PRESENT ILLNESS:  78 year old woman with history of asthma, hypertension, T2DM and 2 recent episodes of acute onset shortness of breath followed by reportedly becoming unresponsive (per husband). Earlier today, the patient was in her usual state of health eating dinner when she developed sudden onset dyspnea which progressed again to a period of unconsciousness as reported by her husband. On arrival to the emergency department, she was moderately hypoxemic requiring 6 LPM oxygen to saturate in the low to mid 90s by SpO2. CXR revealed evidence of acute pulmonary edema. An ABG was performed shortly after arrival which showed 7.28/60/125/31 (the PCO2 level was similar on 07/21/24 accounting for venous nature of the earlier sample). No fever, chills, cough preceding this or at present.  Her evaluation prior to this admission had included laryngoscopy with ENT (no abnormalities noted at that time with respect to her vocal cord function -- though VCD is a dynamic process of course). She also reported dysphagia to ENT so a GI referral was recommended for possible upper endoscopy.  When seen by our group on 10/07/2024, note was made of the prior finding of acute pulmonary edema during an earlier admission for the same, which along with her other symptoms suggested laryngospasm complicated by development of pulmonary edema. Intensive GERD therapy was recommended as well as evaluation by GI.  The patient herself  provides a limited history today due to marked lethargy following today's events. Significant portion of the history was provided by her husband.  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  has a past surgical history that includes Appendectomy; Abdominal hysterectomy; Colonoscopy; and Polypectomy (2005). Prior to Admission medications  Medication Sig Start Date End Date Taking? Authorizing Provider  acetaminophen  (TYLENOL ) 500 MG tablet Take 500 mg by mouth every 6 (six) hours as needed (for pain).   Yes [provider]  albuterol  (VENTOLIN  HFA) 108 (90 Base) MCG/ACT inhaler Inhale 1 puff into the lungs every 6 (six) hours as needed for wheezing or shortness of breath. 03/10/21  Yes [place], Eric J, DO  atenolol -chlorthalidone  (TENORETIC ) 100-25 MG per tablet Take 1 tablet by mouth daily.   Yes [provider]  [employer] Lancets Micro Thin 33G MISC daily. for testing 07/29/19  Yes [provider]  ergocalciferol (VITAMIN D2) 1.25 MG (50000 UT) capsule Take 50,000 Units by mouth every Saturday.   Yes [provider]  ezetimibe  (ZETIA ) 10 MG tablet Take 10 mg by mouth daily.   Yes [provider]  famotidine  (PEPCID ) 20 MG tablet One after supper 10/07/24  Yes Darlean Ozell NOVAK, MD  furosemide  (LASIX ) 20 MG tablet Take 1 tablet (20  mg total) by mouth daily. 07/18/24  Yes Monetta Redell PARAS, MD  naproxen sodium  (ALEVE) 220 MG tablet Take 220 mg by mouth 2 (two) times daily as needed (for pain).   Yes [provider]  pantoprazole  (PROTONIX ) 40 MG tablet TAKE 1 TABLET (40 MG TOTAL) BY MOUTH DAILY. TAKE 30-60 MIN BEFORE FIRST MEAL OF THE DAY 11/11/24  Yes Darlean Ozell NOVAK, MD  potassium chloride  (KLOR-CON ) 10 MEQ tablet Take 1 tablet (10 mEq total) by mouth daily. 08/19/24  Yes Tobb, Kardie, DO  TRUETRACK TEST test strip CHECK DAILY BLOOD SUGAR 07/29/19  Yes [provider]  calcium -vitamin D (OSCAL WITH D) 250-125 MG-UNIT tablet Take 1 tablet by mouth daily. Patient not taking: Reported on 11/22/2024    [provider]  KLOR-CON  M20 20 MEQ tablet Take 20 mEq by mouth daily. Patient not taking: Reported on 11/22/2024 08/13/24   [provider]   Allergies[1]  FAMILY HISTORY:  family history includes Colon cancer in her maternal grandfather; Emphysema in her maternal aunt; Heart disease in her father; Hypertension in her father; Stroke in her father and mother. SOCIAL HISTORY:  reports that she has never smoked. She has been exposed to tobacco smoke. She has never used smokeless tobacco. She reports current alcohol use. She reports that she does not use drugs.  REVIEW OF SYSTEMS:   Constitutional: Negative for fever, chills, weight loss. Respiratory: Negative for cough, hemoptysis, sputum production. Cardiovascular: Negative for chest pain, palpitations, leg swelling.    SUBJECTIVE:   VITAL SIGNS: Temp:  [97.8 F (36.6 C)-98.1 F (36.7 C)] 98.1 F (36.7 C) (12/13 0030) Pulse Rate:  [65-88] 72 (12/13 0130) Resp:  [18-33] 18 (12/13 0130) BP: (79-175)/(45-88) 103/49 (12/13 0130) SpO2:  [79 %-100 %] 98 % (12/13 0130) Weight:  [75.9 kg-76.6 kg] 76.6 kg (12/13 0030)  PHYSICAL EXAMINATION: General:  Lethargic appearing elderly woman, wearing [place] oxygen. HEENT:  Dry MM. MP IV airway. Cardiovascular: RRR without m/r/g. Lungs:  Crackles bilaterally on  inspiration. Abdomen:  Soft, NTND. Musculoskeletal: Moves all extremities spontaneously and equally. No lower extremity edema. WWP. Skin:  No rashes.  Recent Labs  Lab 11/22/24 1942 11/22/24 1944  NA 139 138  K 2.6* 2.4*  CL 94* 93*  CO2 30  --   BUN 27* 29*  CREATININE 1.34* 1.40*  GLUCOSE 382* 370*   Recent Labs  Lab 11/22/24 1942 11/22/24 1944  HGB 13.5 14.3  HCT 41.5 42.0  WBC 18.4*  --   PLT 351  --    CT Angio Chest Pulmonary Embolism (PE) W or WO Contrast Result Date: 11/23/2024 EXAM: CTA of the Chest with contrast for PE 11/22/2024 11:58:32 PM TECHNIQUE: CTA of the chest was performed without and with the administration of 60 mL of iohexol  (OMNIPAQUE ) 350 MG/ML injection. Multiplanar reformatted images are provided for review. MIP images are provided for review. Automated exposure control, iterative reconstruction, and/or weight based adjustment of the mA/kV was utilized to reduce the radiation dose to as low as reasonably achievable. COMPARISON: Chest radiograph earlier today and CTA chest dated 07/13/2024. CLINICAL HISTORY: Pulmonary embolism (PE) suspected, high prob. FINDINGS: PULMONARY ARTERIES: Pulmonary arteries are adequately opacified for evaluation. No pulmonary embolism. Main pulmonary artery is normal in caliber. MEDIASTINUM: Mild cardiomegaly. The pericardium demonstrates no acute abnormality. Mild thoracic aortic atherosclerosis. LYMPH NODES: No mediastinal, hilar or axillary lymphadenopathy. LUNGS AND PLEURA: Multifocal patchy opacities in the lungs bilaterally, with mild interlobular septal thickening in the upper lobes, favoring  mild to moderate interstitial edema. However, multifocal pneumonia or aspiration remains possible. Mild patchy bilateral lower lobe opacities, favoring superimposed atelectasis. No pleural effusion or pneumothorax. UPPER ABDOMEN: Limited images of the upper abdomen are unremarkable. SOFT TISSUES AND BONES: No acute bone or soft tissue  abnormality. IMPRESSION: 1. No pulmonary embolism. 2. Multifocal patchy opacities, favoring mild to moderate interstitial edema, although multifocal pneumonia/aspiration remains possible. 3. Superimposed bilateral lower lobe atelectasis. Electronically signed by: Pinkie Pebbles MD 11/23/2024 12:02 AM EST RP Workstation: [id number]   DG Chest Portable 1 View Result Date: 11/22/2024 CLINICAL DATA:  Shortness of breath EXAM: PORTABLE CHEST 1 VIEW COMPARISON:  07/21/2024 FINDINGS: Mild cardiomegaly. Probable trace pleural effusions. Heterogeneous perihilar interstitial and ground-glass opacity. No pneumothorax IMPRESSION: Cardiomegaly with heterogeneous perihilar interstitial and ground-glass opacity, this may represent edema, pneumonia or hemorrhage. Suspicion of trace pleural effusions Electronically Signed   By: Luke Bun M.D.   On: 11/22/2024 20:36   DG Neck Soft Tissue Result Date: 11/22/2024 CLINICAL DATA:  Shortness of breath EXAM: NECK SOFT TISSUES - 1+ VIEW COMPARISON:  None Available. FINDINGS: Epiglottis demonstrates normal thickness. Slight hyper aeration of the hypopharynx. Normal prevertebral soft tissue thickness. Subglottic trachea is obscured by the patient's shoulders. IMPRESSION: Slight hyper aeration of the hypopharynx. Subglottic trachea is obscured by the patient's shoulders. No radiopaque foreign body in the included portions of the upper airway Electronically Signed   By: Luke Bun M.D.   On: 11/22/2024 20:35    ASSESSMENT / PLAN:  Acute Hypoxemic Respiratory Failure P: Suspect secondary to acute pulmonary edema, which itself may well be due to recurrent laryngospasm. Episodes have mostly occurred with 1 or both of 2 identifiable triggers which are eating (perhaps triggering GERD / LPL) and psychological stressors -- both known to be associated with and/or causes of laryngospasm. Recommend BID PPI for GERD, supportive care and gentle diuresis for her acute pulmonary edema  and hypoxemia, judicious use of nebulized beta-agonist / bronchodilator therapy, and follow up with GI - likely as outpatient.  Acid-Base Disorder (NOS): - PaCO2 60 this admission though in setting of acute respiratory distress this could simply reflect acute respiratory muscle fatigue. Prior VBG showing 7.31/63 though also in setting of respiratory distress. Overall cause and significance of this abnormality is unclear at this time. Consider repeating prior to discharge when the patient's condition has been further stabilized.   For now, I have recommended monitoring the patient in Step Down Unit. NIPPV could be considered if there is a worsening of either her hypoxemia or hypercapnia or work of breathing, as it would address both components of her respiratory failure.   Critical Care Time: 60 minutes   Lamar Dales, MD Pulmonary, Critical Care & Sleep Medicine [facility]  Pager: see AMION   If no response to pager, please call critical care on call (see AMION) until 7pm After 7:00 pm call E-Link  11/23/2024, 1:38 AM     [1]  Allergies Allergen Reactions   Monosodium Glutamate Shortness Of Breath   Atorvastatin Other (See Comments)    Myalgias   Sulfa Antibiotics Other (See Comments)    Reaction occurred during childhood- reaction not recalled   Prednisone Other (See Comments)    Hyperactivity

## 2024-11-23 NOTE — Progress Notes (Signed)
 Echocardiogram 2D Echocardiogram has been performed.  Jsoeph Podesta N Sheronica Corey,RDCS 11/23/2024, 8:51 AM

## 2024-11-23 NOTE — Progress Notes (Signed)
 eLink Physician-Brief Progress Note Patient Name: Debra Galvan DOB: 15-Oct-1946 MRN: [id number]   Date of Service  11/23/2024  HPI/Events of Note  Patient went into Afib RVR Rate 140's for brief period approx 2 mins and broke and is back in NSR now.  Patient BP was stable and asymptomatic during the episode  eICU Interventions  K 3.2 at 4 pm, Ongoing K correction. Will recheck in AM 2D echo pending Discussed with BSRN     Intervention Category Intermediate Interventions: Arrhythmia - evaluation and management  [name] 11/23/2024, 10:07 PM

## 2024-11-24 LAB — CBC WITH DIFFERENTIAL/PLATELET
Abs Immature Granulocytes: 0.06 K/uL (ref 0.00–0.07)
Basophils Absolute: 0 K/uL (ref 0.0–0.1)
Basophils Relative: 0 %
Eosinophils Absolute: 0.1 K/uL (ref 0.0–0.5)
Eosinophils Relative: 0 %
HCT: 31.1 % — ABNORMAL LOW (ref 36.0–46.0)
Hemoglobin: 10.6 g/dL — ABNORMAL LOW (ref 12.0–15.0)
Immature Granulocytes: 0 %
Lymphocytes Relative: 9 %
Lymphs Abs: 1.3 K/uL (ref 0.7–4.0)
MCH: 30.3 pg (ref 26.0–34.0)
MCHC: 34.1 g/dL (ref 30.0–36.0)
MCV: 88.9 fL (ref 80.0–100.0)
Monocytes Absolute: 0.9 K/uL (ref 0.1–1.0)
Monocytes Relative: 6 %
Neutro Abs: 11.5 K/uL — ABNORMAL HIGH (ref 1.7–7.7)
Neutrophils Relative %: 85 %
Platelets: 193 K/uL (ref 150–400)
RBC: 3.5 MIL/uL — ABNORMAL LOW (ref 3.87–5.11)
RDW: 12.3 % (ref 11.5–15.5)
WBC: 13.9 K/uL — ABNORMAL HIGH (ref 4.0–10.5)
nRBC: 0 % (ref 0.0–0.2)

## 2024-11-24 LAB — BASIC METABOLIC PANEL WITH GFR
Anion gap: 9 (ref 5–15)
Anion gap: 8 (ref 5–15)
BUN: 16 mg/dL (ref 8–23)
BUN: 17 mg/dL (ref 8–23)
CO2: 30 mmol/L (ref 22–32)
CO2: 32 mmol/L (ref 22–32)
Calcium: 8.8 mg/dL — ABNORMAL LOW (ref 8.9–10.3)
Calcium: 8.8 mg/dL — ABNORMAL LOW (ref 8.9–10.3)
Chloride: 100 mmol/L (ref 98–111)
Chloride: 98 mmol/L (ref 98–111)
Creatinine, Ser: 1.08 mg/dL — ABNORMAL HIGH (ref 0.44–1.00)
Creatinine, Ser: 0.98 mg/dL (ref 0.44–1.00)
GFR, Estimated: 52 mL/min — ABNORMAL LOW (ref >60)
GFR, Estimated: 59 mL/min — ABNORMAL LOW (ref >60)
Glucose, Bld: 181 mg/dL — ABNORMAL HIGH (ref 70–99)
Glucose, Bld: 114 mg/dL — ABNORMAL HIGH (ref 70–99)
Potassium: 2.9 mmol/L — ABNORMAL LOW (ref 3.5–5.1)
Potassium: 3.1 mmol/L — ABNORMAL LOW (ref 3.5–5.1)
Sodium: 139 mmol/L (ref 135–145)
Sodium: 138 mmol/L (ref 135–145)

## 2024-11-24 LAB — GLUCOSE, CAPILLARY
Glucose-Capillary: 160 mg/dL — ABNORMAL HIGH (ref 70–99)
Glucose-Capillary: 117 mg/dL — ABNORMAL HIGH (ref 70–99)
Glucose-Capillary: 120 mg/dL — ABNORMAL HIGH (ref 70–99)
Glucose-Capillary: 153 mg/dL — ABNORMAL HIGH (ref 70–99)

## 2024-11-24 LAB — URINE CULTURE: Culture: NO GROWTH

## 2024-11-24 LAB — LACTIC ACID, PLASMA: Lactic Acid, Venous: 1.5 mmol/L (ref 0.5–1.9)

## 2024-11-24 LAB — MAGNESIUM
Magnesium: 1.9 mg/dL (ref 1.7–2.4)
Magnesium: 2.2 mg/dL (ref 1.7–2.4)

## 2024-11-24 MED ORDER — POTASSIUM CHLORIDE 10 MEQ/100ML IV SOLN
10.0000 meq | INTRAVENOUS | Status: AC
  Administered 2024-11-24 (×4): 10 meq via INTRAVENOUS
  Filled 2024-11-24 (×4): qty 100

## 2024-11-24 MED ORDER — SODIUM CHLORIDE 0.9 % IV SOLN: INTRAVENOUS | Status: AC | PRN

## 2024-11-24 MED ORDER — POTASSIUM CHLORIDE 20 MEQ PO PACK
40.0000 meq | PACK | Freq: Once | ORAL | Status: AC
  Administered 2024-11-24: 40 meq via ORAL
  Filled 2024-11-24: qty 2

## 2024-11-24 MED ORDER — POTASSIUM CHLORIDE 20 MEQ PO PACK
40.0000 meq | PACK | ORAL | Status: DC
  Administered 2024-11-24: 40 meq
  Filled 2024-11-24: qty 2

## 2024-11-24 MED ORDER — MAGNESIUM SULFATE 2 GM/50ML IV SOLN
2.0000 g | Freq: Once | INTRAVENOUS | Status: AC
  Administered 2024-11-24: 2 g via INTRAVENOUS
  Filled 2024-11-24: qty 50

## 2024-11-24 MED ORDER — POTASSIUM CHLORIDE CRYS ER 20 MEQ PO TBCR
40.0000 meq | EXTENDED_RELEASE_TABLET | Freq: Once | ORAL | Status: DC
  Filled 2024-11-24: qty 2

## 2024-11-24 MED ORDER — POTASSIUM CHLORIDE 20 MEQ PO PACK
40.0000 meq | PACK | ORAL | Status: AC
  Administered 2024-11-24: 40 meq via ORAL
  Filled 2024-11-24: qty 2

## 2024-11-24 NOTE — Plan of Care (Signed)
   Problem: Fluid Volume: Goal: Ability to maintain a balanced intake and output will improve Outcome: Progressing

## 2024-11-24 NOTE — Plan of Care (Signed)
°  Problem: Fluid Volume: Goal: Ability to maintain a balanced intake and output will improve Outcome: Progressing   Problem: Health Behavior/Discharge Planning: Goal: Ability to manage health-related needs will improve Outcome: Progressing   Problem: Activity: Goal: Risk for activity intolerance will decrease Outcome: Progressing   Problem: Coping: Goal: Level of anxiety will decrease Outcome: Progressing   Problem: Elimination: Goal: Will not experience complications related to urinary retention Outcome: Progressing

## 2024-11-24 NOTE — Progress Notes (Signed)
 NAME:  Debra  DEVIN Galvan, MRN:  [id number], DOB:  [birth year], LOS: 2 ADMISSION DATE:  11/22/2024, CONSULTATION DATE:  11/24/2024 REFERRING MD:  Dr. 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 The patient was seen by Dr. Olena overnight from [facility] group.   HPI: 78 year old woman with history of asthma, hypertension, T2DM and 2 recent episodes of acute onset shortness of breath followed by reportedly becoming unresponsive (per husband). Earlier today, the patient was in her usual state of health eating dinner when she developed sudden onset dyspnea which progressed again to a period of unconsciousness as reported by her husband. On arrival to the emergency department, she was moderately hypoxemic requiring 6 LPM oxygen to saturate in the low to mid 90s by SpO2. CXR revealed evidence of acute pulmonary edema. An ABG was performed shortly after arrival which showed 7.28/60/125/31 (the PCO2 level was similar on 07/21/24 accounting for venous nature of the earlier sample). No fever, chills, cough preceding this or at present.   Her evaluation prior to this admission had included laryngoscopy with ENT (no abnormalities noted at that time with respect to her vocal cord function -- though VCD is a dynamic process of course). She also reported dysphagia to ENT so a GI referral was recommended for possible upper endoscopy.   When seen by our group on 10/07/2024, note was made of the prior finding of acute pulmonary edema during an earlier admission for the same, which along with her other symptoms suggested laryngospasm complicated by development of pulmonary edema. Intensive GERD  therapy was recommended as well as evaluation by GI.   The patient herself provides a limited history today due to marked lethargy following today's events. Significant portion of the history was provided by her husband.  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 Fib with RVR in 140s that self resolved.  Interim History / Subjective:  Feels improved, shortness of breath better, cough better, discussed GERD and spicy food and how it can be a trigger for her episodes. 3 episodes recently. Afebrile, HR 60 - 70s, hemodynamics supported with 2 mcg of levophed , SPO2 >  90 on 3 L O2 by Rupert I/O: - 672 mL (overall + 2.6 L) Drips: levophed  2 mcg Micro: MRSA negative, Blood culture x 2 NGTD Labs: Na 139, K 2.9, bicarb 30, Cr 1.08, Mg 1.9, WBC 13 K, Hgb 10.6, PLT 193 Objective    Blood pressure (!) 109/52, pulse 69, temperature 98.6 F (37 C), temperature source Oral, resp. rate 16, height 5' 4 (1.626 m), weight 76.7 kg, SpO2 98%.        Intake/Output Summary (Last 24 hours) at  11/24/2024 0913 Last data filed at 11/24/2024 0657 Gross per 24 hour  Intake 2357.29 ml  Output 2330 ml  Net 27.29 ml   Filed Weights   11/23/24 0030 11/23/24 0400 11/24/24 0400  Weight: 76.6 kg 76.6 kg 76.7 kg    Examination: General: awake, alert, comfortable appearing, no distress HENT: anicteric sclera, well injected conjunctivae, PERRL, nasal and oral cavity intact Lungs: inspiratory crackles worse on R than L posterioly Cardiovascular: RRR, no r/m/g Abdomen: soft, non-tender Extremities: no edema Neuro: A&O x 3, didn't test motor or sensory. GU: deferred  Resolved problem list   Assessment and Plan   #Shock: Improving, levophed  down to 2 mcg and weaning down. > likely septic shock due to pneumonia. Echo without evidence of obstructive or cardiogenic shock. - Wean Levophed  drip as tolerated - Keep MAP > 65 mmHg   #paroxysmal atrial fibrillation: HR in 140s overnight, self resolved. CHADS2VASC 5. Unclear if this related to hypokalemia or acute illness - Holding AV nodal blockade due to hypotension - Holding anticoagulation since I suspect this episode is due to acute illness, hypokalemia, and B adrenergic effect of norepinephrine  - Repleting lytes as needed - Suggested seeing cardiology as an outpatient for Holter monitoring  #Acute Hypoxic Respiratory Failure: improving, now on 3 L from 6 L #Pulmonary Edema: #Community Acquired Pneumonia: > Likely due to acute onset pulmonary edema from VCD or acute on chronic diastolic heart failure. It is also possible patient may have superimposed pneumonia. - Azithromycin  (end date 12/14) - Cefepime  (end date 12/16) - Bumex  1 mg IV daily - Replete lytes at high goal, recheck BMP + Mg at 12 PM   #GERD: - Continue PPI - Continue H2 Blocker - Discussed GERD lifestyle modifications   #DM: A1c 6.9 - Sensitive SSI   #VTE ppx: Lovenox  subQ  Disposition: ICU appropriate for vasopressors  Patient Lines/Drains/Airways Status      Active Line/Drains/Airways     Name Placement date Placement time Site Days   Peripheral IV 11/22/24 20 G 1 Anterior;Right Hand 11/22/24  2002  Hand  2   PICC Double Lumen 11/23/24 Right Basilic 39 cm 0 cm 11/23/24  8158  -- 1            Labs   CBC: Recent Labs  Lab 11/22/24 1942 11/22/24 1944 11/23/24 0127 11/24/24 0257  WBC 18.4*  --  21.1* 13.9*  NEUTROABS  --   --   --  11.5*  HGB 13.5 14.3 12.3 10.6*  HCT 41.5 42.0 37.1 31.1*  MCV 93.3  --  90.5 88.9  PLT 351  --  227 193    Basic Metabolic Panel: Recent Labs  Lab 11/22/24 1942 11/22/24 1944 11/22/24 2244 11/23/24 0127 11/23/24 1258 11/23/24 1553 11/24/24 0257  NA 139 138  --  140 142 140 139  K 2.6* 2.4*  --  2.9* 3.0* 3.2* 2.9*  CL 94* 93*  --  98 104 101 100  CO2 30  --   --  28 27 29 30   GLUCOSE 382* 370*  --  215* 143* 171* 181*  BUN 27* 29*  --  26* 21 20 16   CREATININE 1.34* 1.40*  --  1.18* 1.02* 1.08* 1.08*  CALCIUM  8.7*  --   --  8.9 8.4* 8.9 8.8*  MG  --   --  2.2  --  2.0 2.0 1.9  PHOS  --   --   --   --  2.6  --   --    GFR: Estimated Creatinine Clearance: 43 mL/min (A) (by C-G formula based on SCr of 1.08 mg/dL (H)). Recent Labs  Lab 11/22/24 1942 11/22/24 1945 11/22/24 2241 11/22/24 2244 11/23/24 0127 11/23/24 0256 11/23/24 0445 11/23/24 1007 11/24/24 0257  PROCALCITON  --   --   --  0.46 1.31  --   --   --   --   WBC 18.4*  --   --   --  21.1*  --   --   --  13.9*  LATICACIDVEN  --    < > 3.6*  --   --  4.3* 2.2* 2.9*  --    < > = values in this interval not displayed.    Liver Function Tests: Recent Labs  Lab 11/23/24 0127 11/23/24 1258  AST 26  --   ALT 13  --   ALKPHOS 71  --   BILITOT 0.2  --   PROT 6.2*  --   ALBUMIN 3.4* 3.1*   No results for input(s): LIPASE, AMYLASE in the last 168 hours. No results for input(s): AMMONIA in the last 168 hours.  ABG    Component Value Date/Time   PHART 7.28 (L) 11/22/2024 1944   PCO2ART 60 (H) 11/22/2024  1944   PO2ART 125 (H) 11/22/2024 1944   HCO3 28.2 (H) 11/22/2024 1944   TCO2 31 11/22/2024 1944   O2SAT 99.7 11/22/2024 1944     Coagulation Profile: No results for input(s): INR, PROTIME in the last 168 hours.  Cardiac Enzymes: No results for input(s): CKTOTAL, CKMB, CKMBINDEX, TROPONINI in the last 168 hours.  HbA1C: Hgb A1c MFr Bld  Date/Time Value Ref Range Status  07/22/2024 04:59 AM 6.9 (H) 4.8 - 5.6 % Final    Comment:    (NOTE)         Prediabetes: 5.7 - 6.4         Diabetes: >6.4         Glycemic control for adults with diabetes: <7.0   03/08/2021 11:05 PM 6.9 (H) 4.8 - 5.6 % Final    Comment:    (NOTE) Pre diabetes:          5.7%-6.4%  Diabetes:              >6.4%  Glycemic control for   <7.0% adults with diabetes     CBG: Recent Labs  Lab 11/23/24 0803 11/23/24 1132 11/23/24 1618 11/23/24 2125 11/24/24 0753  GLUCAP 160* 135* 159* 174* 160*    Review of Systems:   Not obtained  Past Medical History:  She,  has a past medical history of Allergy, Anemia, Arthritis, Asthma, Cancer (HCC), Cataract, Colitis (03/08/2021), Diabetes mellitus (HCC), Diverticulosis, Essential hypertension (03/08/2021), GERD (gastroesophageal reflux disease), Gout (07/13/2024), Heart murmur, Hyperlipidemia, Hypertension, Leukocytosis (03/08/2021), Mild intermittent asthma (12/25/2019), Mixed hyperlipidemia (12/26/2019), Osteoporosis, Subclinical hypothyroidism (07/13/2024), Type 2 diabetes mellitus with diabetic chronic kidney disease (HCC) (12/25/2019), and Type 2 diabetes mellitus with obesity (03/08/2021).   Surgical History:  Past Surgical History:  Procedure Laterality Date   ABDOMINAL HYSTERECTOMY     APPENDECTOMY     COLONOSCOPY     POLYPECTOMY  2005   TA polyp     Social History:   reports that she has never smoked. She has been exposed to tobacco smoke. She has never used smokeless tobacco. She reports current alcohol use. She reports that she does  not use drugs.   Family History:  Her family history includes Colon cancer in her maternal grandfather; Emphysema in her maternal aunt; Heart disease in her father; Hypertension in her father; Stroke in her father and mother. There is no history of Breast cancer, Colon polyps, Esophageal cancer, Stomach cancer, Rectal cancer, or BRCA 1/2.   Allergies Allergies[1]   Home Medications  Prior to Admission medications  Medication Sig Start Date End Date Taking? Authorizing Provider  acetaminophen  (TYLENOL ) 500 MG tablet Take 500 mg by mouth every 6 (six) hours as needed (for pain).   Yes [provider]  albuterol  (VENTOLIN  HFA) 108 (90 Base) MCG/ACT inhaler Inhale 1 puff into the lungs every 6 (six) hours as needed for wheezing or shortness of breath. 03/10/21  Yes [place], Eric J, DO  atenolol -chlorthalidone  (TENORETIC ) 100-25 MG per tablet Take 1 tablet by mouth daily.   Yes [provider]  [facility] Lancets Micro Thin 33G MISC daily. for testing 07/29/19  Yes [provider]  ergocalciferol (VITAMIN D2) 1.25 MG (50000 UT) capsule Take 50,000 Units by mouth every Saturday.   Yes [provider]  ezetimibe  (ZETIA ) 10 MG tablet Take 10 mg by mouth daily.   Yes [provider]  famotidine  (PEPCID ) 20 MG tablet One after supper 10/07/24  Yes Darlean Ozell NOVAK, MD  furosemide  (LASIX ) 20 MG tablet Take 1 tablet (20 mg total) by mouth daily. 07/18/24  Yes Monetta Redell PARAS, MD  naproxen sodium (ALEVE) 220 MG tablet Take 220 mg by mouth 2 (two) times daily as needed (for pain).   Yes [provider]  pantoprazole  (PROTONIX ) 40 MG tablet TAKE 1 TABLET (40 MG TOTAL) BY MOUTH DAILY. TAKE 30-60 MIN BEFORE FIRST MEAL OF THE DAY 11/11/24  Yes Darlean Ozell NOVAK, MD  potassium chloride  (KLOR-CON ) 10 MEQ tablet Take 1 tablet (10 mEq total) by mouth daily. 08/19/24  Yes Tobb, Kardie, DO  TRUETRACK TEST test strip CHECK DAILY BLOOD SUGAR 07/29/19  Yes [provider]   calcium -vitamin D (OSCAL WITH D) 250-125 MG-UNIT tablet Take 1 tablet by mouth daily. Patient not taking: Reported on 11/22/2024    [provider]  KLOR-CON  M20 20 MEQ tablet Take 20 mEq by mouth daily. Patient not taking: Reported on 11/22/2024 08/13/24   [provider]     Due to a high probability of clinically significant, life threatening deterioration, the patient required my highest level of preparedness to intervene emergently and I personally spent this critical care time directly and personally managing the patient. This critical care time included obtaining a history; examining the patient; pulse oximetry; ordering and review of studies; arranging urgent treatment with development of a management plan; evaluation of patient's response to treatment; frequent reassessment; and, discussions with other providers.  This critical care time was performed to assess and manage the high probability of imminent, life-threatening deterioration that could result in multi-organ failure. It was exclusive of separately billable procedures and treating other patients and teaching time. Critical Care Time: 30 minutes.  Paula Southerly, MD [facility]      [  1]  Allergies Allergen Reactions   Monosodium Glutamate Shortness Of Breath   Atorvastatin Other (See Comments)    Myalgias   Sulfa Antibiotics Other (See Comments)    Reaction occurred during childhood- reaction not recalled   Prednisone Other (See Comments)    Hyperactivity

## 2024-11-24 NOTE — Progress Notes (Signed)
 [facility] ADULT ICU REPLACEMENT PROTOCOL   The patient does apply for the [facility] Adult ICU Electrolyte Replacment Protocol based on the criteria listed below:   1.Exclusion criteria: TCTS, ECMO, Dialysis, and Myasthenia Gravis patients 2. Is GFR >/= 30 ml/min? Yes.    Patient's GFR today is 52 3. Is SCr </= 2? Yes.   Patient's SCr is 1.08 mg/dL 4. Did SCr increase >/= 0.5 in 24 hours? No. 5.Pt's weight >40kg  Yes.   6. Abnormal electrolyte(s): K+ = 2.9, Mg = 1.9  7. Electrolytes replaced per protocol 8.  Call MD STAT for K+ </= 2.5, Phos </= 1, or Mag </= 1 Physician:  [place], eMD   Debra Galvan 11/24/2024 4:16 AM

## 2024-11-24 NOTE — Progress Notes (Signed)
 eLink Physician-Brief Progress Note Patient Name: Debra Galvan  Debra Galvan DOB: [birth year] MRN: [id number]   Date of Service  11/24/2024  HPI/Events of Note  Patient on Levo now but level of care still listed as Stepdown, requesting change/update to ICU  eICU Interventions  Level of care changed to ICU     Intervention Category Minor Interventions: Other:  [name] 11/24/2024, 1:55 AM

## 2024-11-25 ENCOUNTER — Other Ambulatory Visit (HOSPITAL_COMMUNITY): Payer: Self-pay

## 2024-11-25 DIAGNOSIS — J9601 Acute respiratory failure with hypoxia: Secondary | ICD-10-CM | POA: Diagnosis not present

## 2024-11-25 LAB — GLUCOSE, CAPILLARY
Glucose-Capillary: 135 mg/dL — ABNORMAL HIGH (ref 70–99)
Glucose-Capillary: 171 mg/dL — ABNORMAL HIGH (ref 70–99)
Glucose-Capillary: 156 mg/dL — ABNORMAL HIGH (ref 70–99)
Glucose-Capillary: 148 mg/dL — ABNORMAL HIGH (ref 70–99)

## 2024-11-25 LAB — RENAL FUNCTION PANEL
Albumin: 3.3 g/dL — ABNORMAL LOW (ref 3.5–5.0)
Anion gap: 8 (ref 5–15)
BUN: 19 mg/dL (ref 8–23)
CO2: 30 mmol/L (ref 22–32)
Calcium: 9.1 mg/dL (ref 8.9–10.3)
Chloride: 102 mmol/L (ref 98–111)
Creatinine, Ser: 1.09 mg/dL — ABNORMAL HIGH (ref 0.44–1.00)
GFR, Estimated: 52 mL/min — ABNORMAL LOW (ref >60)
Glucose, Bld: 140 mg/dL — ABNORMAL HIGH (ref 70–99)
Phosphorus: 2.1 mg/dL — ABNORMAL LOW (ref 2.5–4.6)
Potassium: 3.4 mmol/L — ABNORMAL LOW (ref 3.5–5.1)
Sodium: 140 mmol/L (ref 135–145)

## 2024-11-25 LAB — MAGNESIUM: Magnesium: 1.8 mg/dL (ref 1.7–2.4)

## 2024-11-25 MED ORDER — MAGNESIUM SULFATE 2 GM/50ML IV SOLN
2.0000 g | Freq: Once | INTRAVENOUS | Status: AC
  Administered 2024-11-25: 20:00:00 2 g via INTRAVENOUS
  Filled 2024-11-25: qty 50

## 2024-11-25 MED ORDER — K PHOS MONO-SOD PHOS DI & MONO 155-852-130 MG PO TABS
250.0000 mg | ORAL_TABLET | Freq: Four times a day (QID) | ORAL | Status: DC
  Administered 2024-11-26 (×2): 250 mg via ORAL
  Filled 2024-11-25 (×2): qty 1

## 2024-11-25 MED ORDER — POTASSIUM CHLORIDE 20 MEQ PO PACK
40.0000 meq | PACK | Freq: Once | ORAL | Status: AC
  Administered 2024-11-25: 21:00:00 40 meq via ORAL
  Filled 2024-11-25: qty 2

## 2024-11-25 NOTE — Progress Notes (Signed)
 Mobility Specialist - Progress Note   11/25/24 0849  Mobility  Activity Ambulated independently  Level of Assistance Standby assist, set-up cues, supervision of patient - no hands on  Assistive Device None  Distance Ambulated (ft) 160 ft  Range of Motion/Exercises Active  Activity Response Tolerated well  Mobility visit 1 Mobility  Mobility Specialist Start Time (ACUTE ONLY) 0830  Mobility Specialist Stop Time (ACUTE ONLY) 0840  Mobility Specialist Time Calculation (min) (ACUTE ONLY) 10 min   Pt was found in bed and agreeable to mobilize. Grew fatigued with session. At EOS returned to bed with all needs met. Call bell in reach and husband in room.   Erminio Leos,  Mobility Specialist Can be reached via Secure Chat

## 2024-11-25 NOTE — TOC Initial Note (Signed)
 Transition of Care [facility]) - Initial/Assessment Note   Patient Details  Name: Debra  LAKEITHA Galvan MRN: [id number] Date of Birth: 11-05-1946  [facility]) CM/SW Contact:    Duwaine GORMAN Aran, LCSW Phone Number: 11/25/2024, 10:53 AM  Clinical Narrative: Patient is from home with spouse. Patient is currently receiving IV antibiotics. Care management following for possible discharge needs.  Expected Discharge Plan: Home/Self Care Barriers to Discharge: Continued Medical Work up  Expected Discharge Plan and Services In-house Referral: Clinical Social Work Living arrangements for the past 2 months: Single Family Home           DME Arranged: N/A DME Agency: NA  Prior Living Arrangements/Services Living arrangements for the past 2 months: Single Family Home Lives with:: Spouse Patient language and need for interpreter reviewed:: Yes Do you feel safe going back to the place where you live?: Yes      Need for Family Participation in Patient Care: No (Comment) Care giver support system in place?: Yes (comment) Criminal Activity/Legal Involvement Pertinent to Current Situation/Hospitalization: No - Comment as needed  Activities of Daily Living ADL Screening (condition at time of admission) Independently performs ADLs?: Yes (appropriate for developmental age) Is the patient deaf or have difficulty hearing?: No Does the patient have difficulty seeing, even when wearing glasses/contacts?: No Does the patient have difficulty concentrating, remembering, or making decisions?: No  Emotional Assessment Orientation: : Oriented to Self, Oriented to Place, Oriented to  Time, Oriented to Situation Alcohol / Substance Use: Not Applicable Psych Involvement: No (comment)  Admission diagnosis:  Acute respiratory failure with hypoxia (HCC) [J96.01] Community acquired pneumonia, unspecified laterality [J18.9] Patient Active Problem List   Diagnosis Date Noted   Laryngospasm 11/23/2024    Controlled type 2 diabetes mellitus without complication, without long-term current use of insulin  with hyperglycemia 11/22/2024   Sepsis (HCC) 11/22/2024   CKD stage 3a, GFR 45-59 ml/min (HCC) 11/22/2024   Vocal cord dysfunction 10/07/2024   Acute hypoxemic respiratory failure (HCC) 07/21/2024   Asthma exacerbation 07/21/2024   Hypokalemia 07/21/2024   Elevated troponin 07/21/2024   Cardiomegaly 07/21/2024   Bibasilar pulmonary edema 07/21/2024   Allergic reaction 07/21/2024   Allergy    Anemia    Arthritis    Asthma    Cancer (HCC)    Cataract    Diverticulosis    GERD (gastroesophageal reflux disease)    Heart murmur    Hyperlipidemia    Hypertension    Osteoporosis    Gout 07/13/2024   Subclinical hypothyroidism 07/13/2024   Colitis 03/08/2021   Leukocytosis 03/08/2021   Essential hypertension 03/08/2021   Type 2 diabetes mellitus with obesity 03/08/2021   Mixed hyperlipidemia 12/26/2019   Mild intermittent asthma 12/25/2019   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

## 2024-11-25 NOTE — Progress Notes (Signed)
 PROGRESS NOTE    Debra Galvan  Debra Galvan  FMW:[id number] DOB: May 23, 1946 DOA: 11/22/2024 PCP: Stephane Leita DEL, MD  Outpatient Specialists:     Brief Narrative:  Patient is a 78 year old female with past medical history significant for diabetes mellitus, diverticulosis, hypertension, hyperlipidemia, asthma, type 2 diabetes mellitus, and  vocal cord dysfunction.  Patient was said to have become more unresponsive while eating dinner with her husband.  Associated shortness of breath.  Patient became hypotensive, with worsening leukocytosis during the hospital stay.  Patient was transferred to the ICU team for management of hypotension, sepsis and acute on chronic diastolic CHF.  Patient required pressors.  Patient was also noted to be in atrial fibrillation during ICU stay, thought to be adrenergic driven.  TRH seems care back today, 11/25/2024.  11/25/2024: Patient seen alongside patient's husband.  Patient remains in atrial fibrillation.  I have a low threshold to consult the cardiology team.  Patient is known to Dr. Elder, local cardiologist.  Patient is currently not on anticoagulation.  Antibiotics to be completed on 11/26/2024.  Patient also wants to follow-up with pulmonary team as soon as possible on discharge.   CTA chest revealed: 1. No pulmonary embolism. 2. Multifocal patchy opacities, favoring mild to moderate interstitial edema, although multifocal pneumonia/aspiration remains possible. 3. Superimposed bilateral lower lobe atelectasis.  Echo revealed: 1. Left ventricular ejection fraction, by estimation, is >75%. The left  ventricle has hyperdynamic function. The left ventricle has no regional  wall motion abnormalities. The left ventricular internal cavity size was  mildly dilated. Left ventricular  diastolic parameters are consistent with Grade I diastolic dysfunction  (impaired relaxation). The average left ventricular global longitudinal  strain is -19.8 %. The global longitudinal  strain is normal.   2. Right ventricular systolic function is normal. The right ventricular  size is normal.   3. The mitral valve is normal in structure. Trivial mitral valve  regurgitation. No evidence of mitral stenosis.   4. The aortic valve is tricuspid. Aortic valve regurgitation is not  visualized. Aortic valve sclerosis is present, with no evidence of aortic  valve stenosis.   5. Aortic dilatation noted. There is mild dilatation of the ascending  aorta, measuring 39 mm.   6. The inferior vena cava is normal in size with greater than 50%  respiratory variability, suggesting right atrial pressure of 3 mmHg.    Assessment & Plan:   Principal Problem:   Acute hypoxemic respiratory failure (HCC) Active Problems:   Essential hypertension   GERD (gastroesophageal reflux disease)   Hyperlipidemia   Hypokalemia   Elevated troponin   Controlled type 2 diabetes mellitus without complication, without long-term current use of insulin  with hyperglycemia   Sepsis (HCC)   CKD stage 3a, GFR 45-59 ml/min (HCC)   Laryngospasm    Acute respiratory failure with hypoxia (HCC) 78 year old presenting to ED with sudden onset respiratory distress and hypoxia to 85% on RA with EMS requiring 6L HFNC -Vocal cord dysfunction. This is her 3rd event and she had a stressful day with her cat dying. Acute event, just happened all of a sudden.  -CXR with cardiomegaly with heterogenous perihilar interstitial and ground glass opacity, may represent edema, pneumonia or hemorrhage. Suspicion of trace pleural effusions  -CCM input is highly appreciated. - D-dimer 4.47 and proBNP of 3213.   -Manage for acute on chronic diastolic CHF. - Acute respiratory failure has resolved.      Sepsis/SIRS: -Meeting SIRS criteria with leukocytosis, lactic acidosis and tachypnea.  -  Source is likely pneumonia.   -Complete course of antibiotics. - Sepsis physiology has resolved.   Hypokalemia -Potassium of 3.4 today.   -  KCl 40 mEq x 1 dose. - Repeat labs in the morning. - Magnesium  of 1.8.  Will give 2 g of magnesium  IV.  Hypophosphatemia:  - Phosphorus of 2.1. - Neutra-Phos.   Elevated troponin Likely demand ischemia in setting of hypoxia and respiratory failure EKG with no ST changes Troponin of 203-260. No chest pain    Controlled type 2 diabetes mellitus without complication, without long-term current use of insulin  with hyperglycemia A1c of 6.9 in 07/2024  Appears to be diet controlled  Continue sensitive SSI and accuchecks QAC/HS  Consider long acting if sugar stays high    Essential hypertension/hypotension: - Hypotension has resolved. - Hypotension was likely secondary to sepsis syndrome.     GERD (gastroesophageal reflux disease) PPI   Hyperlipidemia Continue zetia     CKD stage 3a, GFR 45-59 ml/min (HCC) Baseline creatinine around 1.1-1.2 Stable, continue to monitor   Atrial fibrillation: - This will be a new diagnosis for patient. - Patient is known to Dr. Elder, local cardiologist. - A-fib is thought to be adrenergic driven. - Consult cardiology tomorrow if patient remains in atrial fibrillation. - Patient may need cardiac monitoring on discharge.  DVT prophylaxis: Subcutaneous Lovenox  Code Status: Full code Family Communication: Husband by bedside Disposition Plan: This will depend on hospital course   Consultants:  Critical care medicine  Procedures:  None  Antimicrobials:  IV vancomycin  discontinued. IV cefepime .   Subjective: No new complaints. Not in any distress.  Objective: Vitals:   11/25/24 0442 11/25/24 0500 11/25/24 1003 11/25/24 1400  BP: 122/77  119/75 105/88  Pulse: 74  77 97  Resp: 17  17 17   Temp: 98.2 F (36.8 C)  98.2 F (36.8 C) 98.2 F (36.8 C)  TempSrc: Oral  Oral Oral  SpO2: 92%  96% 96%  Weight:  78.1 kg    Height:        Intake/Output Summary (Last 24 hours) at 11/25/2024 1842 Last data filed at 11/25/2024 0600 Gross per  24 hour  Intake 250 ml  Output 0 ml  Net 250 ml   Filed Weights   11/23/24 0400 11/24/24 0400 11/25/24 0500  Weight: 76.6 kg 76.7 kg 78.1 kg    Examination:  General exam: Appears calm and comfortable  Respiratory system: Clear to auscultation. Respiratory effort normal. Cardiovascular system: S1 & S2 heard Gastrointestinal system: Abdomen is soft and nontender.  Central nervous system: Awake and alert.    Data Reviewed: I have personally reviewed following labs and imaging studies  CBC: Recent Labs  Lab 11/22/24 1942 11/22/24 1944 11/23/24 0127 11/24/24 0257  WBC 18.4*  --  21.1* 13.9*  NEUTROABS  --   --   --  11.5*  HGB 13.5 14.3 12.3 10.6*  HCT 41.5 42.0 37.1 31.1*  MCV 93.3  --  90.5 88.9  PLT 351  --  227 193   Basic Metabolic Panel: Recent Labs  Lab 11/23/24 1258 11/23/24 1553 11/24/24 0257 11/24/24 1242 11/25/24 1720  NA 142 140 139 138 140  K 3.0* 3.2* 2.9* 3.1* 3.4*  CL 104 101 100 98 102  CO2 27 29 30  32 30  GLUCOSE 143* 171* 181* 114* 140*  BUN 21 20 16 17 19   CREATININE 1.02* 1.08* 1.08* 0.98 1.09*  CALCIUM  8.4* 8.9 8.8* 8.8* 9.1  MG 2.0 2.0 1.9 2.2 1.8  PHOS 2.6  --   --   --  2.1*   GFR: Estimated Creatinine Clearance: 43 mL/min (A) (by C-G formula based on SCr of 1.09 mg/dL (H)). Liver Function Tests: Recent Labs  Lab 11/23/24 0127 11/23/24 1258 11/25/24 1720  AST 26  --   --   ALT 13  --   --   ALKPHOS 71  --   --   BILITOT 0.2  --   --   PROT 6.2*  --   --   ALBUMIN 3.4* 3.1* 3.3*   No results for input(s): LIPASE, AMYLASE in the last 168 hours. No results for input(s): AMMONIA in the last 168 hours. Coagulation Profile: No results for input(s): INR, PROTIME in the last 168 hours. Cardiac Enzymes: No results for input(s): CKTOTAL, CKMB, CKMBINDEX, TROPONINI in the last 168 hours. BNP (last 3 results) Recent Labs    11/22/24 2244 11/23/24 0445  PROBNP 384.0* 3,213.0*   HbA1C: No results for input(s):  HGBA1C in the last 72 hours. CBG: Recent Labs  Lab 11/24/24 1706 11/24/24 2140 11/25/24 0733 11/25/24 1223 11/25/24 1645  GLUCAP 120* 153* 135* 171* 156*   Lipid Profile: No results for input(s): CHOL, HDL, LDLCALC, TRIG, CHOLHDL, LDLDIRECT in the last 72 hours. Thyroid Function Tests: No results for input(s): TSH, T4TOTAL, FREET4, T3FREE, THYROIDAB in the last 72 hours. Anemia Panel: No results for input(s): VITAMINB12, FOLATE, FERRITIN, TIBC, IRON, RETICCTPCT in the last 72 hours. Urine analysis:    Component Value Date/Time   COLORURINE YELLOW 11/23/2024 0341   APPEARANCEUR CLEAR 11/23/2024 0341   LABSPEC 1.025 11/23/2024 0341   PHURINE 6.0 11/23/2024 0341   GLUCOSEU NEGATIVE 11/23/2024 0341   HGBUR NEGATIVE 11/23/2024 0341   BILIRUBINUR NEGATIVE 11/23/2024 0341   KETONESUR NEGATIVE 11/23/2024 0341   PROTEINUR NEGATIVE 11/23/2024 0341   NITRITE NEGATIVE 11/23/2024 0341   LEUKOCYTESUR LARGE (A) 11/23/2024 0341   Sepsis Labs: @LABRCNTIP (procalcitonin:4,lacticidven:4)  ) Recent Results (from the past 240 hours)  Culture, blood (Routine X 2) w Reflex to ID Panel     Status: None (Preliminary result)   Collection Time: 11/22/24  8:39 PM   Specimen: BLOOD  Result Value Ref Range Status   Specimen Description   Final    BLOOD LEFT ANTECUBITAL Performed at [facility], [address], [place], [place] [address]    Special Requests   Final    BOTTLES DRAWN AEROBIC ONLY Blood Culture results may not be optimal due to an inadequate volume of blood received in culture bottles Performed at [facility], [address], [place], [place] [address]    Culture   Final    NO GROWTH 3 DAYS Performed at [facility], [address], [place], [place] [address]    Report Status PENDING  Incomplete  Culture, blood (Routine X 2) w Reflex to ID Panel     Status: None (Preliminary result)   Collection  Time: 11/22/24  8:40 PM   Specimen: BLOOD  Result Value Ref Range Status   Specimen Description   Final    BLOOD SITE NOT SPECIFIED Performed at [facility], [address] [name], [place] [address]    Special Requests   Final    BOTTLES DRAWN AEROBIC AND ANAEROBIC Blood Culture results may not be optimal due to an inadequate volume of blood received in culture bottles Performed at [facility], [address], [place], [place] [address]    Culture  Final    NO GROWTH 3 DAYS Performed at [facility], [address] [place], [place] [address]    Report Status PENDING  Incomplete  MRSA Next Gen by PCR, Nasal     Status: None   Collection Time: 11/23/24 12:58 AM   Specimen: Nasal Mucosa; Nasal Swab  Result Value Ref Range Status   MRSA by PCR Next Gen NOT DETECTED NOT DETECTED Final    Comment: (NOTE) The GeneXpert MRSA Assay (FDA approved for NASAL specimens only), is one component of a comprehensive MRSA colonization surveillance program. It is not intended to diagnose MRSA infection nor to guide or monitor treatment for MRSA infections. Test performance is not FDA approved in patients less than 40 years old. Performed at [facility], [address] [place], [place] [address]   Urine Culture (for pregnant, neutropenic or urologic patients or patients with an indwelling urinary catheter)     Status: None   Collection Time: 11/23/24  3:41 AM   Specimen: Urine, Clean Catch  Result Value Ref Range Status   Specimen Description   Final    URINE, CLEAN CATCH Performed at [facility], [address] [place], [place] [address]    Special Requests   Final    NONE Performed at [facility], [address], [place], [place] [address]    Culture   Final    NO GROWTH Performed at [facility], [address], [place], [place] [address]    Report Status 11/24/2024 FINAL   Final         Radiology Studies: No results found.       Scheduled Meds:  Chlorhexidine  Gluconate Cloth  6 each Topical Daily   enoxaparin  (LOVENOX ) injection  40 mg Subcutaneous Q24H   ezetimibe   10 mg Oral Daily   famotidine   20 mg Oral BID   insulin  aspart  0-5 Units Subcutaneous QHS   insulin  aspart  0-9 Units Subcutaneous TID WC   pantoprazole  (PROTONIX ) IV  40 mg Intravenous Q24H   sodium chloride  flush  10-40 mL Intracatheter Q12H   Continuous Infusions:  ceFEPime  (MAXIPIME ) IV 2 g (11/25/24 1621)     LOS: 3 days    Time spent: 55 minutes.    Leatrice Chapel, MD  [facility] Pager #: [phone] 7PM-7AM contact night coverage as above

## 2024-11-26 ENCOUNTER — Other Ambulatory Visit (HOSPITAL_COMMUNITY): Payer: Self-pay

## 2024-11-26 LAB — RENAL FUNCTION PANEL
Albumin: 3.4 g/dL — ABNORMAL LOW (ref 3.5–5.0)
Anion gap: 10 (ref 5–15)
BUN: 19 mg/dL (ref 8–23)
CO2: 28 mmol/L (ref 22–32)
Calcium: 9.4 mg/dL (ref 8.9–10.3)
Chloride: 103 mmol/L (ref 98–111)
Creatinine, Ser: 0.91 mg/dL (ref 0.44–1.00)
GFR, Estimated: >60 mL/min (ref >60)
Glucose, Bld: 128 mg/dL — ABNORMAL HIGH (ref 70–99)
Phosphorus: 4 mg/dL (ref 2.5–4.6)
Potassium: 3.9 mmol/L (ref 3.5–5.1)
Sodium: 141 mmol/L (ref 135–145)

## 2024-11-26 LAB — GLUCOSE, CAPILLARY
Glucose-Capillary: 134 mg/dL — ABNORMAL HIGH (ref 70–99)
Glucose-Capillary: 179 mg/dL — ABNORMAL HIGH (ref 70–99)
Glucose-Capillary: 165 mg/dL — ABNORMAL HIGH (ref 70–99)
Glucose-Capillary: 131 mg/dL — ABNORMAL HIGH (ref 70–99)

## 2024-11-26 LAB — MAGNESIUM: Magnesium: 2.2 mg/dL (ref 1.7–2.4)

## 2024-11-26 MED ORDER — PANTOPRAZOLE SODIUM 40 MG PO TBEC
40.0000 mg | DELAYED_RELEASE_TABLET | Freq: Every day | ORAL | Status: DC
  Administered 2024-11-26 – 2024-11-27 (×2): 40 mg via ORAL
  Filled 2024-11-26 (×2): qty 1

## 2024-11-26 NOTE — Plan of Care (Signed)
   Problem: Coping: Goal: Level of anxiety will decrease Outcome: Progressing

## 2024-11-26 NOTE — Progress Notes (Signed)
 Progress Note    Debra  WANDRA Galvan   FMW:[id number]  DOB: 02/22/1946  DOA: 11/22/2024     4 PCP: Stephane Leita DEL, MD  Initial CC: syncope   Hospital Course: Debra Galvan is a 78 year old female with past medical history significant for diabetes mellitus, diverticulosis, hypertension, hyperlipidemia, asthma, type 2 diabetes mellitus, and  vocal cord dysfunction.   She presented after having recurrent episode of syncope at home.  She endorses developing difficulty breathing when eating feeling that she is having difficulty swallowing along with difficulty breathing.  This triggers further panic and eventually she passes out during these episodes.  Husband states she has not had to be intubated because of these and does not have recollection of the events given that she syncopizes.  Patient became hypotensive, with worsening leukocytosis during the hospital stay.  Patient was transferred to the ICU team for management of hypotension, sepsis and acute on chronic diastolic CHF.  Patient required pressors.  Patient was also noted to be in atrial fibrillation during ICU stay, considered precipitated from acute illness.    Assessment & Plan   Acute respiratory failure with hypoxia Sepsis due to PNA Aspiration  78 year old presenting to ED with sudden onset respiratory distress and hypoxia to 85% on RA with EMS requiring 6L HFNC -CT angio chest noted with multifocal patchy opacities concerning for edema versus infiltrates.  Also noted to have bibasilar atelectasis - s/p abx; course completed  Vocal cord dysfunction Syncope  - Concern is for laryngeal spasm with some vocal cord dysfunction all worsened by underlying panic/anxiety during the episodes.  Recent increased stress from cat passing away recently; episodes seem to be correlated when eating also and becoming short of breath - Has been evaluated by laryngoscopy with ENT in the past with no abnormalities noted - She has been started on GERD  treatment as well - Would benefit from SLP evaluation for modified barium swallow   Atrial fibrillation - No prior known history although patient states possibly noted to have an abnormal heartbeat in the past during a prior illness - has been presumed precipitated from acute illness - followed by cardiology for HTN currently  - continue on tele for now; will need zio patch at discharge - CHA2DS2-VASc = 6 points  Hypokalemia Hypophosphatemia - replete as needed   Elevated troponin Likely demand ischemia in setting of hypoxia and respiratory failure EKG with no ST changes No chest pain   DMII A1c of 6.9 in 07/2024  Appears to be diet controlled  Continue sensitive SSI and accuchecks QAC/HS    Essential hypertension/hypotension - Hypotension has resolved. - Hypotension was likely secondary to sepsis syndrome.     GERD (gastroesophageal reflux disease) -Continue Protonix  and Pepcid    Hyperlipidemia Continue zetia     CKD stage 3a, GFR 45-59 ml/min (HCC) Baseline creatinine around 1.1-1.2 Stable, continue to monitor  Interval History:  No events overnight.  Husband present bedside this morning. Seems to be tolerating diet fairly well.   Antimicrobials: Azithromycin  11/22/2024 >> 11/24/2024 Rocephin  11/22/2024 x 1 Cefepime  11/23/2024 >> 11/26/2024  DVT prophylaxis:  enoxaparin  (LOVENOX ) injection 40 mg Start: 11/23/24 1000   Code Status:   Code Status: Full Code  Mobility Assessment (Last 72 Hours)     Mobility Assessment     Row Name 11/26/24 0800 11/25/24 2200 11/25/24 0900 11/25/24 0523 11/24/24 2200   Does the patient have exclusion criteria? No- Perform mobility assessment No- Perform mobility assessment No- Perform mobility assessment -- No-  Perform mobility assessment   What is the highest level of mobility based on the mobility assessment? Level 4 (Ambulates with assistance) - Balance while stepping forward/back - Complete Level 4 (Ambulates with  assistance) - Balance while stepping forward/back - Complete Level 4 (Ambulates with assistance) - Balance while stepping forward/back - Complete -- Level 4 (Ambulates with assistance) - Balance while stepping forward/back - Complete   Is the above level different from baseline mobility prior to current illness? -- Yes - Recommend PT order -- Yes - Recommend PT order Yes - Recommend PT order    Row Name 11/24/24 2100 11/24/24 14:40:22 11/24/24 0800 11/23/24 2000     Does the patient have exclusion criteria? No- Perform mobility assessment No- Perform mobility assessment No- Perform mobility assessment No- Perform mobility assessment    What is the highest level of mobility based on the mobility assessment? Level 4 (Ambulates with assistance) - Balance while stepping forward/back - Complete Level 4 (Ambulates with assistance) - Balance while stepping forward/back - Complete Level 4 (Ambulates with assistance) - Balance while stepping forward/back - Complete Level 4 (Ambulates with assistance) - Balance while stepping forward/back - Complete    Is the above level different from baseline mobility prior to current illness? Yes - Recommend PT order -- Yes - Recommend PT order Yes - Recommend PT order       Diet: Diet Orders (From admission, onward)     Start     Ordered   11/22/24 2255  Diet Carb Modified Room service appropriate? Yes  Diet effective now       Question Answer Comment  Diet-HS Snack? Nothing   Calorie Level Medium 1600-2000   Fluid consistency: Thin   Room service appropriate? Yes      11/22/24 2254            Barriers to discharge: None Disposition Plan: Home HH orders placed: N/A Status is: Inpatient  Objective: Blood pressure 136/70, pulse 70, temperature 98.3 F (36.8 C), resp. rate 15, height 5' 4 (1.626 m), weight 72.4 kg, SpO2 97%.  Examination:  Physical Exam Constitutional:      Appearance: Normal appearance.  HENT:     Head: Normocephalic and  atraumatic.     Mouth/Throat:     Mouth: Mucous membranes are moist.  Eyes:     Extraocular Movements: Extraocular movements intact.  Cardiovascular:     Rate and Rhythm: Normal rate. Rhythm irregular.  Pulmonary:     Effort: Pulmonary effort is normal. No respiratory distress.     Breath sounds: Normal breath sounds. No wheezing.  Abdominal:     General: Bowel sounds are normal. There is no distension.     Palpations: Abdomen is soft.     Tenderness: There is no abdominal tenderness.  Musculoskeletal:        General: Normal range of motion.     Cervical back: Normal range of motion and neck supple.  Skin:    General: Skin is warm and dry.  Neurological:     General: No focal deficit present.     Mental Status: She is alert.  Psychiatric:        Mood and Affect: Mood normal.      Consultants:  Pulmonology  Procedures:    Data Reviewed: Results for orders placed or performed during the hospital encounter of 11/22/24 (from the past 24 hours)  Glucose, capillary     Status: Abnormal   Collection Time: 11/25/24  4:45 PM  Result  Value Ref Range   Glucose-Capillary 156 (H) 70 - 99 mg/dL  Renal function panel     Status: Abnormal   Collection Time: 11/25/24  5:20 PM  Result Value Ref Range   Sodium 140 135 - 145 mmol/L   Potassium 3.4 (L) 3.5 - 5.1 mmol/L   Chloride 102 98 - 111 mmol/L   CO2 30 22 - 32 mmol/L   Glucose, Bld 140 (H) 70 - 99 mg/dL   BUN 19 8 - 23 mg/dL   Creatinine, Ser 8.90 (H) 0.44 - 1.00 mg/dL   Calcium  9.1 8.9 - 10.3 mg/dL   Phosphorus 2.1 (L) 2.5 - 4.6 mg/dL   Albumin 3.3 (L) 3.5 - 5.0 g/dL   GFR, Estimated 52 (L) >60 mL/min   Anion gap 8 5 - 15  Magnesium      Status: None   Collection Time: 11/25/24  5:20 PM  Result Value Ref Range   Magnesium  1.8 1.7 - 2.4 mg/dL  Glucose, capillary     Status: Abnormal   Collection Time: 11/25/24  9:00 PM  Result Value Ref Range   Glucose-Capillary 148 (H) 70 - 99 mg/dL  Renal function panel     Status:  Abnormal   Collection Time: 11/26/24  5:04 AM  Result Value Ref Range   Sodium 141 135 - 145 mmol/L   Potassium 3.9 3.5 - 5.1 mmol/L   Chloride 103 98 - 111 mmol/L   CO2 28 22 - 32 mmol/L   Glucose, Bld 128 (H) 70 - 99 mg/dL   BUN 19 8 - 23 mg/dL   Creatinine, Ser 9.08 0.44 - 1.00 mg/dL   Calcium  9.4 8.9 - 10.3 mg/dL   Phosphorus 4.0 2.5 - 4.6 mg/dL   Albumin 3.4 (L) 3.5 - 5.0 g/dL   GFR, Estimated >39 >39 mL/min   Anion gap 10 5 - 15  Magnesium      Status: None   Collection Time: 11/26/24  5:04 AM  Result Value Ref Range   Magnesium  2.2 1.7 - 2.4 mg/dL  Glucose, capillary     Status: Abnormal   Collection Time: 11/26/24  7:32 AM  Result Value Ref Range   Glucose-Capillary 134 (H) 70 - 99 mg/dL  Glucose, capillary     Status: Abnormal   Collection Time: 11/26/24 12:16 PM  Result Value Ref Range   Glucose-Capillary 179 (H) 70 - 99 mg/dL    I have reviewed pertinent nursing notes, vitals, labs, and images as necessary. I have ordered labwork to follow up on as indicated.  I have reviewed the last notes from staff over past 24 hours. I have discussed patient's care plan and test results with nursing staff, CM/SW, and other staff as appropriate.  Old records reviewed in assessment of this patient  Time spent: Greater than 50% of the 55 minute visit was spent in counseling/coordination of care for the patient as laid out in the A&P.   LOS: 4 days   Alm Apo, MD [facility] 11/26/2024, 3:57 PM

## 2024-11-26 NOTE — Hospital Course (Signed)
 Debra Galvan is a 78 year old female with past medical history significant for diabetes mellitus, diverticulosis, hypertension, hyperlipidemia, asthma, type 2 diabetes mellitus, and  vocal cord dysfunction.   She presented after having recurrent episode of syncope at home.  She endorses developing difficulty breathing when eating feeling that she is having difficulty swallowing along with difficulty breathing.  This triggers further panic and eventually she passes out during these episodes.  Husband states she has not had to be intubated because of these and does not have recollection of the events given that she syncopizes.  Patient became hypotensive, with worsening leukocytosis during the hospital stay.  Patient was transferred to the ICU team for management of hypotension, sepsis and acute on chronic diastolic CHF.  Patient required pressors.  Patient was also noted to be in atrial fibrillation during ICU stay, considered precipitated from acute illness.    Assessment & Plan   Acute respiratory failure with hypoxia Sepsis due to PNA Aspiration  78 year old presenting to ED with sudden onset respiratory distress and hypoxia to 85% on RA with EMS requiring 6L HFNC -CT angio chest noted with multifocal patchy opacities concerning for edema versus infiltrates.  Also noted to have bibasilar atelectasis - s/p abx; course completed  Vocal cord dysfunction Syncope  - Concern is for laryngeal spasm with some vocal cord dysfunction all worsened by underlying panic/anxiety during the episodes.  Recent increased stress from cat passing away recently; episodes seem to be correlated when eating also and becoming short of breath - Has been evaluated by laryngoscopy with ENT in the past with no abnormalities noted - She has been started on GERD treatment as well - Would benefit from SLP evaluation for modified barium swallow   Atrial fibrillation - No prior known history although patient states possibly noted  to have an abnormal heartbeat in the past during a prior illness - has been presumed precipitated from acute illness - followed by cardiology for HTN currently  - continue on tele for now; will need zio patch at discharge - CHA2DS2-VASc = 6 points  Hypokalemia Hypophosphatemia - replete as needed   Elevated troponin Likely demand ischemia in setting of hypoxia and respiratory failure EKG with no ST changes No chest pain   DMII A1c of 6.9 in 07/2024  Appears to be diet controlled  Continue sensitive SSI and accuchecks QAC/HS    Essential hypertension/hypotension - Hypotension has resolved. - Hypotension was likely secondary to sepsis syndrome.     GERD (gastroesophageal reflux disease) -Continue Protonix  and Pepcid    Hyperlipidemia Continue zetia     CKD stage 3a, GFR 45-59 ml/min (HCC) Baseline creatinine around 1.1-1.2 Stable, continue to monitor

## 2024-11-27 ENCOUNTER — Inpatient Hospital Stay (HOSPITAL_COMMUNITY)

## 2024-11-27 ENCOUNTER — Other Ambulatory Visit (HOSPITAL_COMMUNITY): Payer: Self-pay

## 2024-11-27 ENCOUNTER — Other Ambulatory Visit: Payer: Self-pay | Admitting: Student

## 2024-11-27 ENCOUNTER — Other Ambulatory Visit (HOSPITAL_BASED_OUTPATIENT_CLINIC_OR_DEPARTMENT_OTHER): Payer: Self-pay

## 2024-11-27 DIAGNOSIS — I480 Paroxysmal atrial fibrillation: Secondary | ICD-10-CM | POA: Insufficient documentation

## 2024-11-27 DIAGNOSIS — J9601 Acute respiratory failure with hypoxia: Secondary | ICD-10-CM | POA: Diagnosis not present

## 2024-11-27 DIAGNOSIS — R55 Syncope and collapse: Secondary | ICD-10-CM

## 2024-11-27 DIAGNOSIS — A419 Sepsis, unspecified organism: Secondary | ICD-10-CM | POA: Diagnosis not present

## 2024-11-27 LAB — CBC WITH DIFFERENTIAL/PLATELET
Abs Immature Granulocytes: 0.05 K/uL (ref 0.00–0.07)
Basophils Absolute: 0.1 K/uL (ref 0.0–0.1)
Basophils Relative: 1 %
Eosinophils Absolute: 0.4 K/uL (ref 0.0–0.5)
Eosinophils Relative: 5 %
HCT: 33.1 % — ABNORMAL LOW (ref 36.0–46.0)
Hemoglobin: 11 g/dL — ABNORMAL LOW (ref 12.0–15.0)
Immature Granulocytes: 1 %
Lymphocytes Relative: 18 %
Lymphs Abs: 1.6 K/uL (ref 0.7–4.0)
MCH: 30.1 pg (ref 26.0–34.0)
MCHC: 33.2 g/dL (ref 30.0–36.0)
MCV: 90.4 fL (ref 80.0–100.0)
Monocytes Absolute: 0.7 K/uL (ref 0.1–1.0)
Monocytes Relative: 8 %
Neutro Abs: 6 K/uL (ref 1.7–7.7)
Neutrophils Relative %: 67 %
Platelets: 260 K/uL (ref 150–400)
RBC: 3.66 MIL/uL — ABNORMAL LOW (ref 3.87–5.11)
RDW: 12.3 % (ref 11.5–15.5)
WBC: 8.8 K/uL (ref 4.0–10.5)
nRBC: 0 % (ref 0.0–0.2)

## 2024-11-27 LAB — CULTURE, BLOOD (ROUTINE X 2)
Culture: NO GROWTH
Culture: NO GROWTH

## 2024-11-27 LAB — GLUCOSE, CAPILLARY
Glucose-Capillary: 133 mg/dL — ABNORMAL HIGH (ref 70–99)
Glucose-Capillary: 112 mg/dL — ABNORMAL HIGH (ref 70–99)

## 2024-11-27 MED ORDER — APIXABAN 5 MG PO TABS
5.0000 mg | ORAL_TABLET | Freq: Two times a day (BID) | ORAL | Status: DC
  Administered 2024-11-27: 13:00:00 5 mg via ORAL
  Filled 2024-11-27: qty 1

## 2024-11-27 MED ORDER — APIXABAN 5 MG PO TABS
5.0000 mg | ORAL_TABLET | Freq: Two times a day (BID) | ORAL | 4 refills | Status: AC
  Filled 2024-11-27: qty 60, 30d supply, fill #0

## 2024-11-27 NOTE — Progress Notes (Signed)
 Patient concerned about nasal passage, like sinus pressure. States that she typically gets some type of sinus pressure or nasal symptom.   No nasal drip, only states a mild headache toward her nose. Given PRN tylenol 

## 2024-11-27 NOTE — Procedures (Signed)
 Modified Barium Swallow Study  Patient Details  Name: Debra Galvan MRN: [id number] Date of Birth: 10/15/1946  Today's Date: 11/27/2024  Modified Barium Swallow completed.  Full report located under Chart Review in the Imaging Section.  History of Present Illness Debra  Galvan is a 78 y.o. female who presented to the hospital on 11/22/24 after becoming unresponsive while eating dinner at home with her husband, gasping for air but denied choking on food. In ED, CT angio/chest/PE reported: multifocal patchy opacities favoring moderate interstitial edema,although multifocal pneumonia/aspiration remains possible. She became hypotensive, worsening leukocytosis and transferred to ICU. She was admitted with acute respiratory failure with hypoxia, sepsis due to PNA, aspiration. MBS with SLP ordered by MD. PMH: vocal cord dysfunction, DM, HTN, HLD, asthma, GERD, OA. Laryngoscopy on 08/22/24 by ENT reported: laryngoscopy today is relatively normal.   Clinical Impression Debra  Galvan presents with a mild oropharyngeal dysphagia as per this MBS. One instance of penetration to the vocal cords of thin liquids that did not clear (PAS 5) and occured when she was attempting to swallow 1/2 13mm barium tablet with thin liquid barium. Otherwise, no penetration or aspiration and only very trace residuals on tongue base. Esophageal sweep did not reveal any barium stasis or retrograde flow. SLP recommended that patient try taking medications with a thicker liquid or a pudding/puree texture solid. In addition, SLP recommending OP SLP for her vocal cord dysfunction. (inducible laryngeal obstruction-ILO)  DIGEST Swallow Severity Rating*  Safety: 1  Efficiency:0  Overall Pharyngeal Swallow Severity: 1 1: mild; 2: moderate; 3: severe; 4: profound  *The Dynamic Imaging Grade of Swallowing Toxicity is standardized for the head and neck cancer population, however, demonstrates promising clinical applications across  populations to standardize the clinical rating of pharyngeal swallow safety and severity.   Swallow Evaluation Recommendations Recommendations: PO diet PO Diet Recommendation: Regular;Thin liquids (Level 0) Liquid Administration via: Cup;Straw Medication Administration: Whole meds with puree Supervision: Patient able to self-feed Swallowing strategies  : Slow rate;Small bites/sips Postural changes: Position pt fully upright for meals Oral care recommendations: Oral care BID (2x/day);Pt independent with oral care      Debra IVAR Blase, MA, CCC-SLP Speech Therapy  11/27/2024,12:47 PM

## 2024-11-27 NOTE — Discharge Summary (Signed)
 Physician Discharge Summary   Debra  JOCELIN Galvan FMW:[id number] DOB: Jan 31, 1946 DOA: 11/22/2024  PCP: Stephane Leita DEL, MD  Admit date: 11/22/2024 Discharge date: 11/27/2024  Admitted From: Home Disposition:  Home Discharging physician: Alm Apo, MD Barriers to discharge: none  Recommendations at discharge: Follow up with cardiology as well as zio patch results  Referral placed to speech therapy for VCD   Discharge Condition: stable CODE STATUS: Full Diet recommendation:  Diet Orders (From admission, onward)     Start     Ordered   11/27/24 0000  Diet Carb Modified        11/27/24 1240   11/22/24 2255  Diet Carb Modified Room service appropriate? Yes  Diet effective now       Question Answer Comment  Diet-HS Snack? Nothing   Calorie Level Medium 1600-2000   Fluid consistency: Thin   Room service appropriate? Yes      11/22/24 2254            Hospital Course: Ms. Locastro is a 78 year old female with past medical history significant for diabetes mellitus, diverticulosis, hypertension, hyperlipidemia, asthma, type 2 diabetes mellitus, and  vocal cord dysfunction.   She presented after having recurrent episode of syncope at home.  She endorses developing difficulty breathing when eating feeling that she is having difficulty swallowing along with difficulty breathing.  This triggers further panic and eventually she passes out during these episodes.  Husband states she has not had to be intubated because of these and does not have recollection of the events given that she syncopizes.  Patient became hypotensive, with worsening leukocytosis during the hospital stay.  Patient was transferred to the ICU team for management of hypotension, sepsis and acute on chronic diastolic CHF.  Patient required pressors.  Patient was also noted to be in atrial fibrillation during ICU stay, considered precipitated from acute illness.    Assessment & Plan   Acute respiratory failure with  hypoxia Sepsis due to PNA Aspiration  78 year old presenting to ED with sudden onset respiratory distress and hypoxia to 85% on RA with EMS requiring 6L HFNC -CT angio chest noted with multifocal patchy opacities concerning for edema versus infiltrates.  Also noted to have bibasilar atelectasis - s/p abx; course completed  Vocal cord dysfunction Syncope  - Concern is for laryngeal spasm with some vocal cord dysfunction all worsened by underlying panic/anxiety during the episodes.  Recent increased stress from cat passing away recently; episodes seem to be correlated when eating also and becoming short of breath - Has been evaluated by laryngoscopy with ENT in the past with no abnormalities noted - She has been started on GERD treatment as well - Underwent SLP evaluation and modified barium swallow on 11/27/2024.  Mild oropharyngeal dysphagia with 1 instance of penetration to the vocal cords of thin liquids that did not clear and occurred when attempting to swallow half tablet of 13 mm barium tablet with thin liquid barium otherwise no other penetration or aspiration noted.  She was recommended for outpatient referral to speech therapy for vocal cord dysfunction.  Referral placed at discharge   Atrial fibrillation - No prior known history although patient states possibly noted to have an abnormal heartbeat in the past during a prior illness - has been presumed precipitated from acute illness however acute issues are resolved and remains in persistent afib at this time; given high stroke risk we extensively discussed risks and benefits of anticoagulation (husband present too). Most of her prior falls  were associated with the VCD whereby she becomes stressed/anxious and starts to get SOB and 1 out of last 3 episodes did she lose consciousness but was already seated.  We discussed if she ever has recurrent episodes of having difficulty swallowing or breathing that she needs to continue remaining seated  or laying down in case of developing recurrent syncope.  After discussion, patient and husband were comfortable initiating on Eliquis  with further conversations to be held outpatient with cardiology at follow-up as well -Rate has been controlled during hospitalization without treatment however she is on atenolol  chronically as a home medication which was continued as well -Contacted cardiology for Zio patch placement to be mailed at discharge as well to evaluate A-fib burden at follow-up -Patient preference was to follow up with Dr. Pietro as he also takes care of her husband as well - CHA2DS2-VASc = 6 points - Eliquis  started prior to discharge  Hypokalemia Hypophosphatemia - repleted   Elevated troponin Likely demand ischemia in setting of hypoxia and respiratory failure EKG with no ST changes No chest pain   DMII A1c of 6.9 in 07/2024  Appears to be diet controlled  Continue sensitive SSI and accuchecks QAC/HS    Essential hypertension/hypotension - Hypotension has resolved. - Hypotension was likely secondary to sepsis syndrome.     GERD (gastroesophageal reflux disease) -Continue Protonix  and Pepcid    Hyperlipidemia Continue zetia     CKD stage 3a, GFR 45-59 ml/min (HCC) Baseline creatinine around 1.1-1.2 Stable, continue to monitor  Assessment and Plan: * Acute hypoxemic respiratory failure (HCC) 78 year old presenting to ED with sudden onset respiratory distress and hypoxia to 85% on RA with EMS requiring 6L HFNC -admit to stepdown -? Vocal cord dysfunction. This is her 3rd event and she had a stressful day with her cat dying. Acute event, just happened all of a sudden.  -CXR with cardiomegaly with heterogenous perihilar interstitial and ground glass opacity, may represent edema, pneumonia or hemorrhage. Suspicion of trace pleural effusions  -given total of 1.5L IVF, blood pressure remains soft, but MAP starting to remain around 70 -will continue broad spectrum abx and  trend PCT and lactic acid  -check d-dimer and BNP (echo with normal EF and diastolic function in 07/2024)  -if had vocal cord dysfunction-? Some component of negative pressure edema. Repeat CXR. Decrease IVF with stable MAP, may need IV lasix   Sepsis (HCC) -Meeting SIRS criteria with leukocytosis, lactic acidosis and tachypnea. Source unknown, possibly pneumonia but she has been asymptomatic prior to the acute onset at dinner  -pan culture, UA has not been collected  -change to broad spectrum abx  -CXR with ground glass opacity, may represent edema, pneumonia or hemorrhage. No hx of fever or cough  -trend lactic acid, possibly due to hypoxia  -trend PCT  Hypokalemia Check magnesium , but given IV mag in ED Repleted with 60meq and 10meq IV in ED Monitor on tele  She is on lasix  outpatient, no exogenous losses  Trend   Elevated troponin Likely demand ischemia in setting of hypoxia and respiratory failure EKG with no ST changes, repeat pending No chest pain  Continue to cycle, watch on tele   Controlled type 2 diabetes mellitus without complication, without long-term current use of insulin  with hyperglycemia A1c of 6.9 in 07/2024  Appears to be diet controlled  Hyperglycemic, AG of 15. UA pending Given IVF, repeat bmp  Sensitive SSI and accuchecks QAC/HS  Consider long acting if sugar stays high   Essential hypertension Hypotensive,  hold anti HTN meds   GERD (gastroesophageal reflux disease) PPI IV   Hyperlipidemia Continue zetia    CKD stage 3a, GFR 45-59 ml/min (HCC) Baseline creatinine around 1.1-1.2 Stable, continue to monitor         The patient's acute and chronic medical conditions were treated accordingly. On day of discharge, patient was felt deemed stable for discharge. Patient/family member advised to call PCP or come back to ER if needed.   Principal Diagnosis: Acute hypoxemic respiratory failure [facility])  Discharge Diagnoses: Active Hospital Problems    Diagnosis Date Noted   Acute hypoxemic respiratory failure (HCC) 07/21/2024    Priority: 1.   Sepsis (HCC) 11/22/2024    Priority: 2.   Hypokalemia 07/21/2024    Priority: 3.   Elevated troponin 07/21/2024    Priority: 4.   Controlled type 2 diabetes mellitus without complication, without long-term current use of insulin  with hyperglycemia 11/22/2024    Priority: 5.   Essential hypertension 03/08/2021    Priority: 6.   GERD (gastroesophageal reflux disease)     Priority: 7.   Hyperlipidemia     Priority: 8.   PAF (paroxysmal atrial fibrillation) (HCC) 11/27/2024   Laryngospasm 11/23/2024   CKD stage 3a, GFR 45-59 ml/min (HCC) 11/22/2024    Resolved Hospital Problems  No resolved problems to display.     Discharge Instructions     Ambulatory referral to Speech Therapy   Complete by: As directed    Vocal cord dysfunction   Diet Carb Modified   Complete by: As directed    Increase activity slowly   Complete by: As directed       Allergies as of 11/27/2024       Reactions   Monosodium Glutamate Shortness Of Breath   Atorvastatin Other (See Comments)   Myalgias   Sulfa Antibiotics Other (See Comments)   Reaction occurred during childhood- reaction not recalled   Prednisone Other (See Comments)   Hyperactivity        Medication List     STOP taking these medications    Klor-Con  M20 20 MEQ tablet Generic drug: potassium chloride  SA       TAKE these medications    acetaminophen  500 MG tablet Commonly known as: TYLENOL  Take 500 mg by mouth every 6 (six) hours as needed (for pain).   albuterol  108 (90 Base) MCG/ACT inhaler Commonly known as: VENTOLIN  HFA Inhale 1 puff into the lungs every 6 (six) hours as needed for wheezing or shortness of breath.   atenolol -chlorthalidone  100-25 MG tablet Commonly known as: TENORETIC  Take 1 tablet by mouth daily.   calcium -vitamin D 250-125 MG-UNIT tablet Commonly known as: OSCAL WITH D Take 1 tablet by mouth  daily.   [employer] Lancets Micro Thin 33G Misc daily. for testing   Eliquis  5 MG Tabs tablet Generic drug: apixaban  Take 1 tablet (5 mg total) by mouth 2 (two) times daily.   ergocalciferol 1.25 MG (50000 UT) capsule Commonly known as: VITAMIN D2 Take 50,000 Units by mouth every Saturday.   ezetimibe  10 MG tablet Commonly known as: ZETIA  Take 10 mg by mouth daily.   famotidine  20 MG tablet Commonly known as: Pepcid  One after supper   furosemide  20 MG tablet Commonly known as: LASIX  Take 1 tablet (20 mg total) by mouth daily.   naproxen sodium 220 MG tablet Commonly known as: ALEVE Take 220 mg by mouth 2 (two) times daily as needed (for pain).   pantoprazole  40 MG tablet Commonly known as: PROTONIX   TAKE 1 TABLET (40 MG TOTAL) BY MOUTH DAILY. TAKE 30-60 MIN BEFORE FIRST MEAL OF THE DAY   potassium chloride  10 MEQ tablet Commonly known as: KLOR-CON  Take 1 tablet (10 mEq total) by mouth daily.   TrueTrack Test test strip Generic drug: glucose blood CHECK DAILY BLOOD SUGAR        Allergies[1]   Discharge Exam: BP (!) 157/78 (BP Location: Left Arm) Comment: notify to the nurse.  Pulse (!) 55 Comment: notify to the nurse.  Temp 98.1 F (36.7 C) (Oral)   Resp 18   Ht 5' 4 (1.626 m)   Wt 75.1 kg   SpO2 96%   BMI 28.42 kg/m  Physical Exam Constitutional:      Appearance: Normal appearance.  HENT:     Head: Normocephalic and atraumatic.     Mouth/Throat:     Mouth: Mucous membranes are moist.  Eyes:     Extraocular Movements: Extraocular movements intact.  Cardiovascular:     Rate and Rhythm: Normal rate. Rhythm irregular.  Pulmonary:     Effort: Pulmonary effort is normal. No respiratory distress.     Breath sounds: Normal breath sounds. No wheezing.  Abdominal:     General: Bowel sounds are normal. There is no distension.     Palpations: Abdomen is soft.     Tenderness: There is no abdominal tenderness.  Musculoskeletal:        General: Normal range of  motion.     Cervical back: Normal range of motion and neck supple.  Skin:    General: Skin is warm and dry.  Neurological:     General: No focal deficit present.     Mental Status: She is alert.  Psychiatric:        Mood and Affect: Mood normal.      The results of significant diagnostics from this hospitalization (including imaging, microbiology, ancillary and laboratory) are listed below for reference.   Microbiology: Recent Results (from the past 240 hours)  Culture, blood (Routine X 2) w Reflex to ID Panel     Status: None   Collection Time: 11/22/24  8:39 PM   Specimen: BLOOD  Result Value Ref Range Status   Specimen Description   Final    BLOOD LEFT ANTECUBITAL Performed at [facility], [address] [place], [place] [address]    Special Requests   Final    BOTTLES DRAWN AEROBIC ONLY Blood Culture results may not be optimal due to an inadequate volume of blood received in culture bottles Performed at [facility], [address] [place], [place] [address]    Culture   Final    NO GROWTH 5 DAYS Performed at [facility], [address] [place], [place] [address]    Report Status 11/27/2024 FINAL  Final  Culture, blood (Routine X 2) w Reflex to ID Panel     Status: None   Collection Time: 11/22/24  8:40 PM   Specimen: BLOOD  Result Value Ref Range Status   Specimen Description   Final    BLOOD SITE NOT SPECIFIED Performed at [facility], [address], [place], [place] [address]    Special Requests   Final    BOTTLES DRAWN AEROBIC AND ANAEROBIC Blood Culture results may not be optimal due to an inadequate volume of blood received in culture bottles Performed at [facility], [address], [place], [place] [address]    Culture  Final    NO GROWTH 5 DAYS Performed at [facility], [address] [place], [place] [address]    Report Status 11/27/2024 FINAL  Final  MRSA  Next Gen by PCR, Nasal     Status: None   Collection Time: 11/23/24 12:58 AM   Specimen: Nasal Mucosa; Nasal Swab  Result Value Ref Range Status   MRSA by PCR Next Gen NOT DETECTED NOT DETECTED Final    Comment: (NOTE) The GeneXpert MRSA Assay (FDA approved for NASAL specimens only), is one component of a comprehensive MRSA colonization surveillance program. It is not intended to diagnose MRSA infection nor to guide or monitor treatment for MRSA infections. Test performance is not FDA approved in patients less than 64 years old. Performed at [facility], [address] [place], [place] [address]   Urine Culture (for pregnant, neutropenic or urologic patients or patients with an indwelling urinary catheter)     Status: None   Collection Time: 11/23/24  3:41 AM   Specimen: Urine, Clean Catch  Result Value Ref Range Status   Specimen Description   Final    URINE, CLEAN CATCH Performed at [facility], [address] [place], [place] [address]    Special Requests   Final    NONE Performed at [facility], [address], [place], [place] [address]    Culture   Final    NO GROWTH Performed at [facility], [address] [place], [place] [address]    Report Status 11/24/2024 FINAL  Final     Labs: BNP (last 3 results) Recent Labs    07/13/24 1251 07/21/24 1425  BNP 139.4* 202.6*   Basic Metabolic Panel: Recent Labs  Lab 11/23/24 1258 11/23/24 1553 11/24/24 0257 11/24/24 1242 11/25/24 1720 11/26/24 0504  NA 142 140 139 138 140 141  K 3.0* 3.2* 2.9* 3.1* 3.4* 3.9  CL 104 101 100 98 102 103  CO2 27 29 30  32 30 28  GLUCOSE 143* 171* 181* 114* 140* 128*  BUN 21 20 16 17 19 19   CREATININE 1.02* 1.08* 1.08* 0.98 1.09* 0.91  CALCIUM  8.4* 8.9 8.8* 8.8* 9.1 9.4  MG 2.0 2.0 1.9 2.2 1.8 2.2  PHOS 2.6  --   --   --  2.1* 4.0   Liver Function Tests: Recent Labs  Lab 11/23/24 0127 11/23/24 1258  11/25/24 1720 11/26/24 0504  AST 26  --   --   --   ALT 13  --   --   --   ALKPHOS 71  --   --   --   BILITOT 0.2  --   --   --   PROT 6.2*  --   --   --   ALBUMIN 3.4* 3.1* 3.3* 3.4*   No results for input(s): LIPASE, AMYLASE in the last 168 hours. No results for input(s): AMMONIA in the last 168 hours. CBC: Recent Labs  Lab 11/22/24 1942 11/22/24 1944 11/23/24 0127 11/24/24 0257 11/27/24 0019  WBC 18.4*  --  21.1* 13.9* 8.8  NEUTROABS  --   --   --  11.5* 6.0  HGB 13.5 14.3 12.3 10.6* 11.0*  HCT 41.5 42.0 37.1 31.1* 33.1*  MCV 93.3  --  90.5 88.9 90.4  PLT 351  --  227 193 260   Cardiac Enzymes: No results for input(s): CKTOTAL, CKMB, CKMBINDEX, TROPONINI in the last 168 hours. BNP: Invalid input(s): POCBNP CBG: Recent  Labs  Lab 11/26/24 1216 11/26/24 1701 11/26/24 2120 11/27/24 0758 11/27/24 1125  GLUCAP 179* 165* 131* 133* 112*   D-Dimer No results for input(s): DDIMER in the last 72 hours. Hgb A1c No results for input(s): HGBA1C in the last 72 hours. Lipid Profile No results for input(s): CHOL, HDL, LDLCALC, TRIG, CHOLHDL, LDLDIRECT in the last 72 hours. Thyroid function studies No results for input(s): TSH, T4TOTAL, T3FREE, THYROIDAB in the last 72 hours.  Invalid input(s): FREET3 Anemia work up No results for input(s): VITAMINB12, FOLATE, FERRITIN, TIBC, IRON, RETICCTPCT in the last 72 hours. Urinalysis    Component Value Date/Time   COLORURINE YELLOW 11/23/2024 0341   APPEARANCEUR CLEAR 11/23/2024 0341   LABSPEC 1.025 11/23/2024 0341   PHURINE 6.0 11/23/2024 0341   GLUCOSEU NEGATIVE 11/23/2024 0341   HGBUR NEGATIVE 11/23/2024 0341   BILIRUBINUR NEGATIVE 11/23/2024 0341   KETONESUR NEGATIVE 11/23/2024 0341   PROTEINUR NEGATIVE 11/23/2024 0341   NITRITE NEGATIVE 11/23/2024 0341   LEUKOCYTESUR LARGE (A) 11/23/2024 0341   Sepsis Labs Recent Labs  Lab 11/22/24 1942 11/23/24 0127  11/24/24 0257 11/27/24 0019  WBC 18.4* 21.1* 13.9* 8.8   Microbiology Recent Results (from the past 240 hours)  Culture, blood (Routine X 2) w Reflex to ID Panel     Status: None   Collection Time: 11/22/24  8:39 PM   Specimen: BLOOD  Result Value Ref Range Status   Specimen Description   Final    BLOOD LEFT ANTECUBITAL Performed at [facility], [address] [place], [place] [address]    Special Requests   Final    BOTTLES DRAWN AEROBIC ONLY Blood Culture results may not be optimal due to an inadequate volume of blood received in culture bottles Performed at [facility], [address], [place], [place] [address]    Culture   Final    NO GROWTH 5 DAYS Performed at [facility], [address], [place], [place] [address]    Report Status 11/27/2024 FINAL  Final  Culture, blood (Routine X 2) w Reflex to ID Panel     Status: None   Collection Time: 11/22/24  8:40 PM   Specimen: BLOOD  Result Value Ref Range Status   Specimen Description   Final    BLOOD SITE NOT SPECIFIED Performed at [facility], [address] [place], [place] [address]    Special Requests   Final    BOTTLES DRAWN AEROBIC AND ANAEROBIC Blood Culture results may not be optimal due to an inadequate volume of blood received in culture bottles Performed at [facility], [address] [place], [place] [address]    Culture   Final    NO GROWTH 5 DAYS Performed at [facility], [address] [place], [place] [address]    Report Status 11/27/2024 FINAL  Final  MRSA Next Gen by PCR, Nasal     Status: None   Collection Time: 11/23/24 12:58 AM   Specimen: Nasal Mucosa; Nasal Swab  Result Value Ref Range Status   MRSA by PCR Next Gen NOT DETECTED NOT DETECTED Final    Comment: (NOTE) The GeneXpert MRSA Assay (FDA approved for NASAL specimens only), is one component of a comprehensive MRSA colonization  surveillance program. It is not intended to diagnose MRSA infection nor to guide or monitor treatment for MRSA infections. Test performance is not FDA approved in patients less than 7 years old. Performed at [facility], [address] [place], [place] [address]   Urine Culture (for pregnant, neutropenic or urologic patients or patients with an indwelling urinary catheter)     Status: None   Collection Time: 11/23/24  3:41 AM   Specimen: Urine, Clean Catch  Result Value Ref Range Status   Specimen Description   Final    URINE, CLEAN CATCH Performed at [facility], [address], [place], [place] [address]    Special Requests   Final    NONE Performed at [facility], [address], [place], [place] [address]    Culture   Final    NO GROWTH Performed at [facility], [address] [place], [place] [address]    Report Status 11/24/2024 FINAL  Final    Procedures/Studies: DG Swallowing Func-Speech Pathology Result Date: 11/27/2024 Table formatting from the original result was not included. Modified Barium Swallow Study Patient Details Name: ASHLEEN DEMMA MRN: [id number] Date of Birth: 02/13/1946 Today's Date: 11/27/2024 HPI/PMH: HPI: Terrika  Huckeby is a 78 y.o. female who presented to the hospital on 11/22/24 after becoming unresponsive while eating dinner at home with her husband, gasping for air but denied choking on food. In ED, CT angio/chest/PE reported: multifocal patchy opacities favoring moderate interstitial edema,although multifocal pneumonia/aspiration remains possible. She became hypotensive, worsening leukocytosis and transferred to ICU. She was admitted with acute respiratory failure with hypoxia, sepsis due to PNA, aspiration. MBS with SLP ordered by MD. PMH: vocal cord dysfunction, DM, HTN, HLD, asthma, GERD, OA. Laryngoscopy on 08/22/24 by ENT reported: laryngoscopy today is relatively normal. Clinical  Impression: Clinical Impression: Darianna  Tellefsen presents with a mild oropharyngeal dysphagia as per this MBS. One instance of penetration to the vocal cords of thin liquids that did not clear (PAS 5) and occured when she was attempting to swallow 1/2 13mm barium tablet with thin liquid barium. Otherwise, no penetration or aspiration and only very trace residuals on tongue base. Esophageal sweep did not reveal any barium stasis or retrograde flow. SLP recommended that patient try taking medications with a thicker liquid or a pudding/puree texture solid. In addition, SLP recommending OP SLP for her vocal cord dysfunction. (inducible laryngeal obstruction-ILO) DIGEST Swallow Severity Rating*  Safety: 1  Efficiency:0  Overall Pharyngeal Swallow Severity: 1 1: mild; 2: moderate; 3: severe; 4: profound *The Dynamic Imaging Grade of Swallowing Toxicity is standardized for the head and neck cancer population, however, demonstrates promising clinical applications across populations to standardize the clinical rating of pharyngeal swallow safety and severity. Recommendations/Plan: Swallowing Evaluation Recommendations Swallowing Evaluation Recommendations Recommendations: PO diet PO Diet Recommendation: Regular; Thin liquids (Level 0) Liquid Administration via: Cup; Straw Medication Administration: Whole meds with puree Supervision: Patient able to self-feed Swallowing strategies  : Slow rate; Small bites/sips Postural changes: Position pt fully upright for meals Oral care recommendations: Oral care BID (2x/day); Pt independent with oral care Treatment Plan Treatment Plan Treatment recommendations: No treatment recommended at this time Follow-up recommendations: Outpatient SLP Recommendations Comment: OP SLP for inducible laryngeal obstruction(ILO) aka vocal cord dysfunction Functional status assessment: Patient has not had a recent decline in their functional status. Recommendations Recommendations for follow up therapy are  one component of a multi-disciplinary discharge planning process, led by the attending physician.  Recommendations may be updated based on patient status, additional functional criteria and insurance authorization. Assessment: Orofacial Exam: Orofacial Exam Oral Cavity: Oral Hygiene: WFL Oral Cavity - Dentition: Adequate natural dentition Orofacial Anatomy: WFL Oral Motor/Sensory  Function: WFL Anatomy: Anatomy: WFL Boluses Administered: Boluses Administered Boluses Administered: Thin liquids (Level 0); Mildly thick liquids (Level 2, nectar thick); Moderately thick liquids (Level 3, honey thick); Puree; Solid  Oral Impairment Domain: Oral Impairment Domain Lip Closure: No labial escape Tongue control during bolus hold: Cohesive bolus between tongue to palatal seal Bolus preparation/mastication: Timely and efficient chewing and mashing Bolus transport/lingual motion: Brisk tongue motion Oral residue: Complete oral clearance Location of oral residue : N/A Initiation of pharyngeal swallow : Posterior laryngeal surface of the epiglottis  Pharyngeal Impairment Domain: Pharyngeal Impairment Domain Soft palate elevation: No bolus between soft palate (SP)/pharyngeal wall (PW) Laryngeal elevation: Complete superior movement of thyroid cartilage with complete approximation of arytenoids to epiglottic petiole Anterior hyoid excursion: Partial anterior movement Epiglottic movement: Complete inversion Laryngeal vestibule closure: Complete, no air/contrast in laryngeal vestibule Pharyngeal stripping wave : Present - complete Pharyngeal contraction (A/P view only): N/A Pharyngoesophageal segment opening: Complete distension and complete duration, no obstruction of flow Tongue base retraction: No contrast between tongue base and posterior pharyngeal wall (PPW) Pharyngeal residue: Trace residue within or on pharyngeal structures Location of pharyngeal residue: Tongue base  Esophageal Impairment Domain: Esophageal Impairment Domain  Esophageal clearance upright position: Complete clearance, esophageal coating Pill: Pill Consistency administered: Thin liquids (Level 0) Thin liquids (Level 0): Impaired (see clinical impressions) Penetration/Aspiration Scale Score: Penetration/Aspiration Scale Score 1.  Material does not enter airway: Mildly thick liquids (Level 2, nectar thick); Moderately thick liquids (Level 3, honey thick); Puree; Solid 5.  Material enters airway, CONTACTS cords and not ejected out: Thin liquids (Level 0) Compensatory Strategies: Compensatory Strategies Compensatory strategies: No   General Information: No data recorded Diet Prior to this Study: Thin liquids (Level 0); Regular   Temperature : Normal   Respiratory Status: WFL   Supplemental O2: None (Room air)   History of Recent Intubation: No  Behavior/Cognition: Alert; Cooperative; Pleasant mood Self-Feeding Abilities: Able to self-feed Baseline vocal quality/speech: Normal No data recorded Volitional Swallow: Able to elicit Exam Limitations: No limitations Goal Planning: No data recorded No data recorded No data recorded No data recorded Consulted and agree with results and recommendations: Patient Pain: Pain Assessment Pain Assessment: No/denies pain End of Session: Start Time:SLP Start Time (ACUTE ONLY): 1120 Stop Time: SLP Stop Time (ACUTE ONLY): 1141 Time Calculation:SLP Time Calculation (min) (ACUTE ONLY): 21 min Charges: SLP Evaluations $ SLP Speech Visit: 1 Visit SLP Evaluations $MBS Swallow: 1 Procedure SLP visit diagnosis: SLP Visit Diagnosis: Dysphagia, oropharyngeal phase (R13.12) Past Medical History: Past Medical History: Diagnosis Date  Allergy   Anemia   Arthritis   shoulder  Asthma   Cancer (HCC)   skin cancer  Cataract   small forming   Colitis 03/08/2021  Diabetes mellitus (HCC)   Diverticulosis   Essential hypertension 03/08/2021  GERD (gastroesophageal reflux disease)   Gout 07/13/2024  Heart murmur   Hyperlipidemia   Hypertension   Leukocytosis  03/08/2021  Mild intermittent asthma 12/25/2019  Mixed hyperlipidemia 12/26/2019  Osteoporosis   Subclinical hypothyroidism 07/13/2024  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 No pleural effusion. No pneumothorax. Mildly increased conspicuity of patchy perihilar predominant airspace opacities. IMPRESSION: Mildly increased conspicuity of patchy perihilar predominant airspace opacities. Differential considerations include pulmonary edema or infection. Electronically Signed   By: Corean Salter M.D.   On: 11/23/2024 18:13   US  EKG SITE RITE Result Date: 11/23/2024 If Site Rite image not attached, placement could not be confirmed due to current cardiac rhythm.  ECHOCARDIOGRAM COMPLETE Result Date: 11/23/2024    ECHOCARDIOGRAM REPORT   Patient Name:   Jayln  N Balistreri Date of Exam: 11/23/2024 Medical Rec #:  [id number]        Height:       64.0 in Accession #:    [id number]       Weight:       168.9 lb Date of Birth:  1946/11/01        BSA:          1.821 m Patient Age:    30 years         BP:           92/41 mmHg Patient Gender: F                HR:           69 bpm. Exam Location:  Inpatient Procedure: 2D Echo, 3D Echo, Cardiac Doppler, Color Doppler and Strain Analysis            (Both Spectral and Color Flow Doppler were utilized during            procedure). STAT ECHO Indications:    Cardiomyopathy  History:        Patient has prior history of  Echocardiogram examinations, most                 recent 07/22/2024. CHF and Cardiomyopathy, Signs/Symptoms:Murmur;                 Risk Factors:Hypertension, Diabetes, Dyslipidemia and Second                 Hand Smoker. Cancer, GERD.  Sonographer:    Nathanel Devonshire Referring Phys: [id number] MARCELLINA RAYMOND DUB IMPRESSIONS  1. Left ventricular ejection fraction, by estimation, is >75%. The left ventricle has hyperdynamic function. The left ventricle has no regional wall motion abnormalities. The left ventricular internal cavity size was mildly dilated. Left ventricular diastolic parameters are consistent with Grade I diastolic dysfunction (impaired relaxation). The average left ventricular global longitudinal strain is -19.8 %. The global longitudinal strain is normal.  2. Right ventricular systolic function is normal. The right ventricular size is normal.  3. The mitral valve is normal in structure. Trivial mitral valve regurgitation. No evidence of mitral stenosis.  4. The aortic valve is tricuspid. Aortic valve regurgitation is not visualized. Aortic valve sclerosis is present, with no evidence of aortic valve stenosis.  5. Aortic dilatation noted. There is mild dilatation of the ascending aorta, measuring 39 mm.  6. The inferior vena cava is normal in size with greater than 50% respiratory variability, suggesting right atrial pressure of 3 mmHg. FINDINGS  Left Ventricle: Left ventricular ejection fraction, by estimation, is >75%. The left ventricle has hyperdynamic function. The left ventricle has no regional wall motion abnormalities. The average left ventricular global longitudinal strain is -19.8 %. Strain was performed and the global longitudinal strain is normal. The left ventricular internal  cavity size was mildly dilated. There is no left ventricular hypertrophy. Left ventricular diastolic parameters are consistent with Grade I diastolic dysfunction (impaired relaxation). Right Ventricle: The right ventricular  size is normal. Right ventricular systolic function is normal. Left Atrium: Left atrial size was normal in size. Right Atrium: Right atrial size was normal in size. Pericardium: There is no evidence of pericardial effusion. Mitral Valve: The mitral valve is normal in structure. Trivial mitral valve regurgitation. No evidence of mitral valve stenosis. Tricuspid Valve: The tricuspid valve is normal in structure. Tricuspid valve regurgitation is trivial. No evidence of tricuspid stenosis. Aortic Valve: The aortic valve is tricuspid. Aortic valve regurgitation is not visualized. Aortic valve sclerosis is present, with no evidence of aortic valve stenosis. Aortic valve peak gradient measures 4.4 mmHg. Pulmonic Valve: The pulmonic valve was not well visualized. Pulmonic valve regurgitation is not visualized. No evidence of pulmonic stenosis. Aorta: Aortic dilatation noted. There is mild dilatation of the ascending aorta, measuring 39 mm. Venous: The inferior vena cava is normal in size with greater than 50% respiratory variability, suggesting right atrial pressure of 3 mmHg. IAS/Shunts: No atrial level shunt detected by color flow Doppler. Additional Comments: 3D was performed not requiring image post processing on an independent workstation and was normal.  LEFT VENTRICLE PLAX 2D LVIDd:         5.30 cm   Diastology LVIDs:         2.50 cm   LV e' medial:    6.20 cm/s LV PW:         1.00 cm   LV E/e' medial:  11.6 LV IVS:        0.90 cm   LV e' lateral:   8.05 cm/s LVOT diam:     1.70 cm   LV E/e' lateral: 8.9 LV SV:         54 LV SV Index:   30        2D Longitudinal Strain LVOT Area:     2.27 cm  2D Strain GLS Avg:     -19.8 %                           3D Volume EF:                          3D EF:        66 %                          LV EDV:       101 ml                          LV ESV:       34 ml                          LV SV:        67 ml RIGHT VENTRICLE             IVC RV Basal diam:  3.90 cm     IVC diam: 1.70 cm  RV Mid diam:    2.60 cm RV S prime:     10.10 cm/s TAPSE (M-mode): 2.0 cm LEFT ATRIUM             Index  RIGHT ATRIUM           Index LA diam:        4.20 cm 2.31 cm/m   RA Area:     19.50 cm LA Vol (A2C):   77.9 ml 42.79 ml/m  RA Volume:   61.20 ml  33.61 ml/m LA Vol (A4C):   63.2 ml 34.71 ml/m LA Biplane Vol: 71.8 ml 39.44 ml/m  AORTIC VALVE AV Area (Vmax): 2.41 cm AV Vmax:        105.00 cm/s AV Peak Grad:   4.4 mmHg LVOT Vmax:      111.33 cm/s LVOT Vmean:     78.433 cm/s LVOT VTI:       0.240 m  AORTA Ao Root diam: 3.00 cm Ao Asc diam:  4.00 cm MITRAL VALVE MV Area (PHT): 3.68 cm    SHUNTS MV Decel Time: 206 msec    Systemic VTI:  0.24 m MV E velocity: 71.80 cm/s  Systemic Diam: 1.70 cm MV A velocity: 76.20 cm/s MV E/A ratio:  0.94 Redell Shallow MD Electronically signed by Redell Shallow MD Signature Date/Time: 11/23/2024/11:20:38 AM    Final    CT Angio Chest Pulmonary Embolism (PE) W or WO Contrast Result Date: 11/23/2024 EXAM: CTA of the Chest with contrast for PE 11/22/2024 11:58:32 PM TECHNIQUE: CTA of the chest was performed without and with the administration of 60 mL of iohexol  (OMNIPAQUE ) 350 MG/ML injection. Multiplanar reformatted images are provided for review. MIP images are provided for review. Automated exposure control, iterative reconstruction, and/or weight based adjustment of the mA/kV was utilized to reduce the radiation dose to as low as reasonably achievable. COMPARISON: Chest radiograph earlier today and CTA chest dated 07/13/2024. CLINICAL HISTORY: Pulmonary embolism (PE) suspected, high prob. FINDINGS: PULMONARY ARTERIES: Pulmonary arteries are adequately opacified for evaluation. No pulmonary embolism. Main pulmonary artery is normal in caliber. MEDIASTINUM: Mild cardiomegaly. The pericardium demonstrates no acute abnormality. Mild thoracic aortic atherosclerosis. LYMPH NODES: No mediastinal, hilar or axillary lymphadenopathy. LUNGS AND PLEURA: Multifocal patchy  opacities in the lungs bilaterally, with mild interlobular septal thickening in the upper lobes, favoring mild to moderate interstitial edema. However, multifocal pneumonia or aspiration remains possible. Mild patchy bilateral lower lobe opacities, favoring superimposed atelectasis. No pleural effusion or pneumothorax. UPPER ABDOMEN: Limited images of the upper abdomen are unremarkable. SOFT TISSUES AND BONES: No acute bone or soft tissue abnormality. IMPRESSION: 1. No pulmonary embolism. 2. Multifocal patchy opacities, favoring mild to moderate interstitial edema, although multifocal pneumonia/aspiration remains possible. 3. Superimposed bilateral lower lobe atelectasis. Electronically signed by: Pinkie Pebbles MD 11/23/2024 12:02 AM EST RP Workstation: [id number]   DG Chest Portable 1 View Result Date: 11/22/2024 CLINICAL DATA:  Shortness of breath EXAM: PORTABLE CHEST 1 VIEW COMPARISON:  07/21/2024 FINDINGS: Mild cardiomegaly. Probable trace pleural effusions. Heterogeneous perihilar interstitial and ground-glass opacity. No pneumothorax IMPRESSION: Cardiomegaly with heterogeneous perihilar interstitial and ground-glass opacity, this may represent edema, pneumonia or hemorrhage. Suspicion of trace pleural effusions Electronically Signed   By: Luke Bun M.D.   On: 11/22/2024 20:36   DG Neck Soft Tissue Result Date: 11/22/2024 CLINICAL DATA:  Shortness of breath EXAM: NECK SOFT TISSUES - 1+ VIEW COMPARISON:  None Available. FINDINGS: Epiglottis demonstrates normal thickness. Slight hyper aeration of the hypopharynx. Normal prevertebral soft tissue thickness. Subglottic trachea is obscured by the patient's shoulders. IMPRESSION: Slight hyper aeration of the hypopharynx. Subglottic trachea is obscured by the patient's shoulders. No radiopaque foreign body in the included portions of the  upper airway Electronically Signed   By: Luke Bun M.D.   On: 11/22/2024 20:35     Time coordinating  discharge: Over 30 minutes    Alm Apo, MD  [facility] 11/27/2024, 3:36 PM    [1]  Allergies Allergen Reactions   Monosodium Glutamate Shortness Of Breath   Atorvastatin Other (See Comments)    Myalgias   Sulfa Antibiotics Other (See Comments)    Reaction occurred during childhood- reaction not recalled   Prednisone Other (See Comments)    Hyperactivity

## 2024-11-27 NOTE — Plan of Care (Signed)

## 2024-11-27 NOTE — Progress Notes (Signed)
 Pt verbalized understanding of dc instructions including Medications, follow up care and when to call the doctor. Assessment unchanged.

## 2024-11-27 NOTE — Discharge Instructions (Addendum)
 Cardiology will schedule your appointment to follow up. We will also arrange for you to wear a heart monitor for 30 days for measure the atrial fibrillation burden and be reviewed.    Information on my medicine - ELIQUIS  (apixaban )   Why was Eliquis  prescribed for you? Eliquis  was prescribed for you to reduce the risk of a blood clot forming that can cause a stroke if you have a medical condition called atrial fibrillation (a type of irregular heartbeat).  What do You need to know about Eliquis  ? Take your Eliquis  TWICE DAILY - one tablet in the morning and one tablet in the evening with or without food. If you have difficulty swallowing the tablet whole please discuss with your pharmacist how to take the medication safely.  Take Eliquis  exactly as prescribed by your doctor and DO NOT stop taking Eliquis  without talking to the doctor who prescribed the medication.  Stopping may increase your risk of developing a stroke.  Refill your prescription before you run out.  After discharge, you should have regular check-up appointments with your healthcare provider that is prescribing your Eliquis .  In the future your dose may need to be changed if your kidney function or weight changes by a significant amount or as you get older.  What do you do if you miss a dose? If you miss a dose, take it as soon as you remember on the same day and resume taking twice daily.  Do not take more than one dose of ELIQUIS  at the same time to make up a missed dose.  Important Safety Information A possible side effect of Eliquis  is bleeding. You should call your healthcare provider right away if you experience any of the following: Bleeding from an injury or your nose that does not stop. Unusual colored urine (red or dark brown) or unusual colored stools (red or black). Unusual bruising for unknown reasons. A serious fall or if you hit your head (even if there is no bleeding).  Some medicines may interact  with Eliquis  and might increase your risk of bleeding or clotting while on Eliquis . To help avoid this, consult your healthcare provider or pharmacist prior to using any new prescription or non-prescription medications, including herbals, vitamins, non-steroidal anti-inflammatory drugs (NSAIDs) and supplements.  This website has more information on Eliquis  (apixaban ): http://[email or web address] .com/eliquis dena

## 2024-11-27 NOTE — Progress Notes (Signed)
Pt d/c via w/c w all belongings in stable condition.

## 2024-11-27 NOTE — Progress Notes (Signed)
 Orders for 30 day monitor for syncopy. Dr Pietro to read.

## 2024-12-07 ENCOUNTER — Other Ambulatory Visit: Payer: Self-pay | Admitting: Internal Medicine

## 2024-12-26 ENCOUNTER — Other Ambulatory Visit (HOSPITAL_COMMUNITY): Payer: Self-pay

## 2025-01-03 ENCOUNTER — Ambulatory Visit: Attending: Cardiology

## 2025-01-03 DIAGNOSIS — R55 Syncope and collapse: Secondary | ICD-10-CM

## 2025-01-04 ENCOUNTER — Ambulatory Visit: Payer: Self-pay | Admitting: Cardiology

## 2025-01-04 DIAGNOSIS — R55 Syncope and collapse: Secondary | ICD-10-CM

## 2025-01-15 ENCOUNTER — Encounter: Payer: Self-pay | Admitting: Cardiology

## 2025-01-15 ENCOUNTER — Ambulatory Visit: Admitting: Cardiology

## 2025-01-15 ENCOUNTER — Telehealth: Payer: Self-pay

## 2025-01-15 VITALS — BP 122/78 | HR 59 | Ht 64.0 in | Wt 156.6 lb

## 2025-01-15 DIAGNOSIS — I10 Essential (primary) hypertension: Secondary | ICD-10-CM

## 2025-01-15 DIAGNOSIS — R55 Syncope and collapse: Secondary | ICD-10-CM | POA: Diagnosis not present

## 2025-01-15 DIAGNOSIS — I2510 Atherosclerotic heart disease of native coronary artery without angina pectoris: Secondary | ICD-10-CM | POA: Diagnosis not present

## 2025-01-15 DIAGNOSIS — E782 Mixed hyperlipidemia: Secondary | ICD-10-CM

## 2025-01-15 DIAGNOSIS — I480 Paroxysmal atrial fibrillation: Secondary | ICD-10-CM | POA: Diagnosis not present

## 2025-01-15 LAB — CBC

## 2025-01-15 NOTE — Patient Instructions (Signed)
 Medication Instructions:  None  *If you need a refill on your cardiac medications before your next appointment, please call your pharmacy*  Lab Work: Today: TSH, FreeT4, CBC, BMET If you have labs (blood work) drawn today and your tests are completely normal, you will receive your results only by: MyChart Message (if you have MyChart) OR A paper copy in the mail If you have any lab test that is abnormal or we need to change your treatment, we will call you to review the results.  Testing/Procedures: None   Follow-Up: At [facility], you and your health needs are our priority.  As part of our continuing mission to provide you with exceptional heart care, our providers are all part of one team.  This team includes your primary Cardiologist (physician) and Advanced Practice Providers or APPs (Physician Assistants and Nurse Practitioners) who all work together to provide you with the care you need, when you need it.  Your next appointment:    As scheduled.   Provider:   Redell Shallow, MD    Other Instructions None

## 2025-01-15 NOTE — Telephone Encounter (Signed)
 I was notified via page about a critical lab of K 2.9. I tried to call Debra Galvan x2 but did not get an answer. I left a voicemail for her and told her that I would try to call her in the AM and have her take an extra dose of her potassium and hold her Lasix .

## 2025-01-16 ENCOUNTER — Ambulatory Visit: Payer: Self-pay | Admitting: Cardiology

## 2025-01-16 ENCOUNTER — Other Ambulatory Visit: Payer: Self-pay | Admitting: Cardiology

## 2025-01-16 ENCOUNTER — Telehealth: Payer: Self-pay | Admitting: Cardiology

## 2025-01-16 DIAGNOSIS — E876 Hypokalemia: Secondary | ICD-10-CM

## 2025-01-16 LAB — BASIC METABOLIC PANEL WITH GFR
BUN/Creatinine Ratio: 22 (ref 12–28)
BUN: 26 mg/dL (ref 8–27)
CO2: 28 mmol/L (ref 20–29)
Calcium: 9.9 mg/dL (ref 8.7–10.3)
Chloride: 92 mmol/L — ABNORMAL LOW (ref 96–106)
Creatinine, Ser: 1.18 mg/dL — ABNORMAL HIGH (ref 0.57–1.00)
Glucose: 168 mg/dL — ABNORMAL HIGH (ref 70–99)
Potassium: 2.9 mmol/L — CL (ref 3.5–5.2)
Sodium: 139 mmol/L (ref 134–144)
eGFR: 47 mL/min/{1.73_m2} — ABNORMAL LOW

## 2025-01-16 LAB — CBC
Hematocrit: 37.6 (ref 34.0–46.6)
Hemoglobin: 12.8 g/dL (ref 11.1–15.9)
MCH: 30.3 pg (ref 26.6–33.0)
MCHC: 34 g/dL (ref 31.5–35.7)
MCV: 89 fL (ref 79–97)
Platelets: 344 10*3/uL (ref 150–450)
RBC: 4.22 x10E6/uL (ref 3.77–5.28)
RDW: 12.1 (ref 11.7–15.4)
WBC: 11 10*3/uL — AB (ref 3.4–10.8)

## 2025-01-16 LAB — TSH+FREE T4
Free T4: 1.23 ng/dL (ref 0.82–1.77)
TSH: 4.45 u[IU]/mL (ref 0.450–4.500)

## 2025-01-16 NOTE — Telephone Encounter (Signed)
 Spoke with patient regarding low potassium on labs drawn yesterday. She tells me that she is not feeling any significant symptoms with this. She agrees that at one point she was needing to take 20 mEq of K and was just recently reduced to 10 mEq. We discussed various options and she agreed to make some medication changes as an outpatient and re-check labs in our [facility] office tomorrow.   Plan: hold Lasix  and atenolol -chlorthalidone  today. Take 20 mEq K this AM and again in the afternoon. Recheck BMP and Mag level tomorrow morning at [address] [facility] and further recommendations will follow.   We discussed symptoms to be aware of and ER precautions. Patient expresses understanding. All questions were answered.   I will forward this message to Rollo Louder, PA-C just so she is aware and can make any further recommendations if needed.  Debra DELENA Donath, PA-C 01/16/2025 7:35 AM

## 2025-01-17 LAB — BASIC METABOLIC PANEL WITH GFR
Calcium: 9.5 mg/dL (ref 8.7–10.3)
Chloride: 96 mmol/L (ref 96–106)
Glucose: 138 mg/dL — ABNORMAL HIGH (ref 70–99)
Potassium: 3.6 mmol/L (ref 3.5–5.2)
Sodium: 143 mmol/L (ref 134–144)

## 2025-01-17 LAB — MAGNESIUM

## 2025-01-20 ENCOUNTER — Ambulatory Visit: Admitting: Internal Medicine

## 2025-01-20 DIAGNOSIS — J383 Other diseases of vocal cords: Secondary | ICD-10-CM

## 2025-02-17 ENCOUNTER — Ambulatory Visit: Admitting: Cardiology
